# Patient Record
Sex: Male | Born: 1950 | Race: White | Hispanic: No | Marital: Single | Smoking: Current every day smoker
Health system: Southern US, Community
[De-identification: ages and names within clinical notes are randomized; demographics above are authoritative.]

## PROBLEM LIST (undated history)

## (undated) DIAGNOSIS — E785 Hyperlipidemia, unspecified: Secondary | ICD-10-CM

## (undated) DIAGNOSIS — F32A Depression, unspecified: Secondary | ICD-10-CM

## (undated) DIAGNOSIS — K429 Umbilical hernia without obstruction or gangrene: Secondary | ICD-10-CM

## (undated) DIAGNOSIS — F329 Major depressive disorder, single episode, unspecified: Secondary | ICD-10-CM

## (undated) DIAGNOSIS — N529 Male erectile dysfunction, unspecified: Secondary | ICD-10-CM

## (undated) HISTORY — PX: FETAL SURGERY FOR CONGENITAL HERNIA: SHX1618

## (undated) HISTORY — DX: Hyperlipidemia, unspecified: E78.5

## (undated) HISTORY — DX: Male erectile dysfunction, unspecified: N52.9

## (undated) HISTORY — DX: Major depressive disorder, single episode, unspecified: F32.9

## (undated) HISTORY — DX: Depression, unspecified: F32.A

---

## 1996-03-21 ENCOUNTER — Encounter: Payer: Self-pay | Admitting: Internal Medicine

## 2004-11-02 ENCOUNTER — Ambulatory Visit: Payer: Self-pay | Admitting: Internal Medicine

## 2004-11-06 ENCOUNTER — Ambulatory Visit: Payer: Self-pay | Admitting: Internal Medicine

## 2004-11-09 ENCOUNTER — Ambulatory Visit: Payer: Self-pay | Admitting: Endocrinology

## 2004-11-13 ENCOUNTER — Encounter: Admission: RE | Admit: 2004-11-13 | Discharge: 2005-02-11 | Payer: Self-pay | Admitting: Endocrinology

## 2004-11-19 ENCOUNTER — Ambulatory Visit: Payer: Self-pay | Admitting: Endocrinology

## 2004-12-04 ENCOUNTER — Ambulatory Visit: Payer: Self-pay | Admitting: Endocrinology

## 2005-01-29 ENCOUNTER — Ambulatory Visit: Payer: Self-pay | Admitting: Endocrinology

## 2005-02-05 ENCOUNTER — Ambulatory Visit: Payer: Self-pay | Admitting: Endocrinology

## 2005-04-19 ENCOUNTER — Ambulatory Visit: Payer: Self-pay | Admitting: Internal Medicine

## 2005-04-28 ENCOUNTER — Ambulatory Visit: Payer: Self-pay | Admitting: Internal Medicine

## 2005-05-06 ENCOUNTER — Ambulatory Visit: Payer: Self-pay | Admitting: Internal Medicine

## 2005-06-22 ENCOUNTER — Ambulatory Visit: Payer: Self-pay | Admitting: Internal Medicine

## 2005-07-30 ENCOUNTER — Ambulatory Visit: Payer: Self-pay | Admitting: Endocrinology

## 2006-01-28 ENCOUNTER — Ambulatory Visit: Payer: Self-pay | Admitting: Endocrinology

## 2006-08-02 ENCOUNTER — Ambulatory Visit: Payer: Self-pay | Admitting: Endocrinology

## 2006-08-10 ENCOUNTER — Ambulatory Visit: Payer: Self-pay | Admitting: Internal Medicine

## 2006-08-18 ENCOUNTER — Ambulatory Visit: Payer: Self-pay | Admitting: Internal Medicine

## 2006-09-15 ENCOUNTER — Ambulatory Visit: Payer: Self-pay | Admitting: Internal Medicine

## 2006-10-17 ENCOUNTER — Ambulatory Visit: Payer: Self-pay | Admitting: Internal Medicine

## 2006-11-23 ENCOUNTER — Ambulatory Visit: Payer: Self-pay | Admitting: Endocrinology

## 2006-12-29 ENCOUNTER — Ambulatory Visit: Payer: Self-pay | Admitting: Endocrinology

## 2007-02-13 ENCOUNTER — Ambulatory Visit: Payer: Self-pay | Admitting: Endocrinology

## 2007-03-30 ENCOUNTER — Encounter: Payer: Self-pay | Admitting: Endocrinology

## 2007-03-30 DIAGNOSIS — E1065 Type 1 diabetes mellitus with hyperglycemia: Secondary | ICD-10-CM

## 2007-03-30 DIAGNOSIS — E1049 Type 1 diabetes mellitus with other diabetic neurological complication: Secondary | ICD-10-CM | POA: Insufficient documentation

## 2007-05-31 ENCOUNTER — Encounter: Payer: Self-pay | Admitting: Endocrinology

## 2007-05-31 ENCOUNTER — Ambulatory Visit: Payer: Self-pay | Admitting: Endocrinology

## 2007-05-31 LAB — CONVERTED CEMR LAB: Hgb A1c MFr Bld: 11.1 % — ABNORMAL HIGH (ref 4.6–6.0)

## 2007-10-23 ENCOUNTER — Emergency Department (HOSPITAL_COMMUNITY): Admission: EM | Admit: 2007-10-23 | Discharge: 2007-10-23 | Payer: Self-pay | Admitting: Family Medicine

## 2007-10-26 ENCOUNTER — Ambulatory Visit: Payer: Self-pay | Admitting: Endocrinology

## 2007-10-26 DIAGNOSIS — R079 Chest pain, unspecified: Secondary | ICD-10-CM

## 2007-10-31 ENCOUNTER — Encounter: Payer: Self-pay | Admitting: Endocrinology

## 2007-10-31 ENCOUNTER — Ambulatory Visit: Payer: Self-pay

## 2007-10-31 ENCOUNTER — Encounter (INDEPENDENT_AMBULATORY_CARE_PROVIDER_SITE_OTHER): Payer: Self-pay | Admitting: *Deleted

## 2008-01-30 ENCOUNTER — Encounter: Payer: Self-pay | Admitting: Endocrinology

## 2008-02-17 DIAGNOSIS — F339 Major depressive disorder, recurrent, unspecified: Secondary | ICD-10-CM

## 2008-02-20 ENCOUNTER — Encounter: Payer: Self-pay | Admitting: Endocrinology

## 2008-03-11 ENCOUNTER — Ambulatory Visit: Payer: Self-pay | Admitting: Internal Medicine

## 2008-03-28 ENCOUNTER — Telehealth: Payer: Self-pay | Admitting: Internal Medicine

## 2008-04-01 ENCOUNTER — Ambulatory Visit: Payer: Self-pay | Admitting: Internal Medicine

## 2008-04-17 ENCOUNTER — Encounter: Payer: Self-pay | Admitting: Internal Medicine

## 2008-05-09 ENCOUNTER — Ambulatory Visit: Payer: Self-pay | Admitting: Internal Medicine

## 2008-05-14 ENCOUNTER — Ambulatory Visit: Payer: Self-pay | Admitting: Professional

## 2008-05-23 ENCOUNTER — Ambulatory Visit: Payer: Self-pay | Admitting: Professional

## 2008-06-30 ENCOUNTER — Emergency Department (HOSPITAL_COMMUNITY): Admission: EM | Admit: 2008-06-30 | Discharge: 2008-06-30 | Payer: Self-pay | Admitting: Emergency Medicine

## 2008-08-27 ENCOUNTER — Ambulatory Visit: Payer: Self-pay | Admitting: Family Medicine

## 2008-08-27 DIAGNOSIS — Z7721 Contact with and (suspected) exposure to potentially hazardous body fluids: Secondary | ICD-10-CM | POA: Insufficient documentation

## 2008-09-02 LAB — CONVERTED CEMR LAB
Chlamydia, Swab/Urine, PCR: NEGATIVE
GC Probe Amp, Urine: NEGATIVE

## 2008-11-26 ENCOUNTER — Telehealth: Payer: Self-pay | Admitting: Endocrinology

## 2009-01-22 ENCOUNTER — Ambulatory Visit: Payer: Self-pay | Admitting: Endocrinology

## 2009-01-22 LAB — CONVERTED CEMR LAB: Hgb A1c MFr Bld: 10.2 % — ABNORMAL HIGH (ref 4.6–6.5)

## 2009-01-30 ENCOUNTER — Telehealth: Payer: Self-pay | Admitting: Endocrinology

## 2009-02-04 ENCOUNTER — Telehealth: Payer: Self-pay | Admitting: Endocrinology

## 2009-02-25 ENCOUNTER — Ambulatory Visit: Payer: Self-pay | Admitting: Family Medicine

## 2009-03-18 ENCOUNTER — Ambulatory Visit: Payer: Self-pay | Admitting: Internal Medicine

## 2009-05-19 ENCOUNTER — Ambulatory Visit: Payer: Self-pay | Admitting: Internal Medicine

## 2009-09-22 ENCOUNTER — Telehealth: Payer: Self-pay | Admitting: Internal Medicine

## 2009-09-26 ENCOUNTER — Ambulatory Visit: Payer: Self-pay | Admitting: Internal Medicine

## 2009-09-29 ENCOUNTER — Ambulatory Visit: Payer: Self-pay | Admitting: Radiology

## 2009-09-29 ENCOUNTER — Ambulatory Visit: Payer: Self-pay | Admitting: Internal Medicine

## 2009-09-29 ENCOUNTER — Encounter: Payer: Self-pay | Admitting: Emergency Medicine

## 2009-09-29 ENCOUNTER — Inpatient Hospital Stay (HOSPITAL_COMMUNITY): Admission: AD | Admit: 2009-09-29 | Discharge: 2009-10-03 | Payer: Self-pay | Admitting: Internal Medicine

## 2009-10-01 ENCOUNTER — Encounter (INDEPENDENT_AMBULATORY_CARE_PROVIDER_SITE_OTHER): Payer: Self-pay | Admitting: Internal Medicine

## 2009-10-02 ENCOUNTER — Encounter: Payer: Self-pay | Admitting: Internal Medicine

## 2009-10-28 ENCOUNTER — Ambulatory Visit: Payer: Self-pay | Admitting: Internal Medicine

## 2009-10-28 DIAGNOSIS — E785 Hyperlipidemia, unspecified: Secondary | ICD-10-CM | POA: Insufficient documentation

## 2009-10-29 ENCOUNTER — Telehealth (INDEPENDENT_AMBULATORY_CARE_PROVIDER_SITE_OTHER): Payer: Self-pay | Admitting: *Deleted

## 2009-10-29 LAB — CONVERTED CEMR LAB
Bilirubin, Direct: 0 mg/dL (ref 0.0–0.3)
LDL Cholesterol: 92 mg/dL (ref 0–99)
Total Bilirubin: 0.3 mg/dL (ref 0.3–1.2)
VLDL: 12 mg/dL (ref 0.0–40.0)

## 2009-11-06 ENCOUNTER — Telehealth: Payer: Self-pay | Admitting: Internal Medicine

## 2009-11-06 ENCOUNTER — Ambulatory Visit: Payer: Self-pay | Admitting: Internal Medicine

## 2009-11-10 ENCOUNTER — Telehealth: Payer: Self-pay | Admitting: Internal Medicine

## 2009-11-11 ENCOUNTER — Ambulatory Visit: Payer: Self-pay | Admitting: Internal Medicine

## 2009-11-12 ENCOUNTER — Encounter: Payer: Self-pay | Admitting: Internal Medicine

## 2009-11-27 ENCOUNTER — Telehealth: Payer: Self-pay | Admitting: Endocrinology

## 2009-12-10 ENCOUNTER — Telehealth: Payer: Self-pay | Admitting: Internal Medicine

## 2009-12-10 ENCOUNTER — Ambulatory Visit: Payer: Self-pay | Admitting: Internal Medicine

## 2009-12-11 LAB — CONVERTED CEMR LAB
ALT: 34 units/L (ref 0–53)
AST: 25 units/L (ref 0–37)

## 2010-02-02 ENCOUNTER — Ambulatory Visit: Payer: Self-pay | Admitting: Internal Medicine

## 2010-02-02 DIAGNOSIS — L255 Unspecified contact dermatitis due to plants, except food: Secondary | ICD-10-CM | POA: Insufficient documentation

## 2010-03-16 ENCOUNTER — Telehealth: Payer: Self-pay | Admitting: Endocrinology

## 2010-03-23 ENCOUNTER — Ambulatory Visit: Payer: Self-pay | Admitting: Internal Medicine

## 2010-03-24 LAB — CONVERTED CEMR LAB
BUN: 19 mg/dL (ref 6–23)
Basophils Absolute: 0 10*3/uL (ref 0.0–0.1)
Chloride: 100 meq/L (ref 96–112)
Creatinine,U: 194.1 mg/dL
Eosinophils Absolute: 0.1 10*3/uL (ref 0.0–0.7)
GFR calc non Af Amer: 100.43 mL/min (ref >60)
Glucose, Bld: 259 mg/dL — ABNORMAL HIGH (ref 70–99)
HCT: 40 % (ref 39.0–52.0)
HDL: 62.4 mg/dL (ref >39.00)
Lymphocytes Relative: 26.2 % (ref 12.0–46.0)
Lymphs Abs: 1.7 10*3/uL (ref 0.7–4.0)
MCV: 96.1 fL (ref 78.0–100.0)
Microalb Creat Ratio: 1.2 mg/g (ref 0.0–30.0)
Monocytes Relative: 6.1 % (ref 3.0–12.0)
Phosphorus: 2.6 mg/dL (ref 2.3–4.6)
Platelets: 233 10*3/uL (ref 150.0–400.0)
RDW: 13.5 % (ref 11.5–14.6)
TSH: 0.87 microintl units/mL (ref 0.35–5.50)
Triglycerides: 42 mg/dL (ref 0.0–149.0)
WBC: 6.7 10*3/uL (ref 4.5–10.5)

## 2010-04-06 ENCOUNTER — Ambulatory Visit: Payer: Self-pay | Admitting: Internal Medicine

## 2010-06-05 ENCOUNTER — Telehealth: Payer: Self-pay | Admitting: Endocrinology

## 2010-06-11 ENCOUNTER — Ambulatory Visit: Payer: Self-pay | Admitting: Endocrinology

## 2010-08-07 ENCOUNTER — Telehealth: Payer: Self-pay | Admitting: Endocrinology

## 2010-08-20 ENCOUNTER — Ambulatory Visit: Payer: Self-pay | Admitting: Family Medicine

## 2010-09-10 ENCOUNTER — Ambulatory Visit: Admit: 2010-09-10 | Payer: Self-pay | Admitting: Endocrinology

## 2010-09-29 NOTE — Assessment & Plan Note (Signed)
Summary: follow up to refill rx-lb   Vital Signs:  Patient profile:   60 year old male Height:      75 inches (190.50 cm) Weight:      183.50 pounds (83.41 kg) BMI:     23.02 O2 Sat:      99 % on Room air Temp:     98.7 degrees F (37.06 degrees C) oral Pulse rate:   72 / minute BP sitting:   128 / 78  (left arm) Cuff size:   regular  Vitals Entered By: Brenton Grills MA (June 11, 2010 11:00 AM)  O2 Flow:  Room air CC: F/U to refil meds/aj Is Patient Diabetic? Yes   Referring Provider:  letvak Primary Provider:  letvak  CC:  F/U to refil meds/aj.  History of Present Illness: pt is long overdue for f/u.  he was hospitalized for dka 8 mos ago. in the hospital, he was changed to a regimen of multiple daily insulin injections.  a1c was high 3 mos ago, and he declines to repeat it today.  no cbg record, but states cbg's are "much better recently."  he says he did not check his cbg for 1 month, approx 1 month ago, when his meter was broken.    Current Medications (verified): 1)  Nitrostat 0.4 Mg  Subl (Nitroglycerin) .... One Tablet Under The Tongue Every 5 Minutes As Needed For Chest Pain 2)  Accu-Chek Aviva   Strp (Glucose Blood) .... Check Blood Sugar Four Times A Day 3)  Citalopram Hydrobromide 40 Mg Tabs (Citalopram Hydrobromide) .Marland Kitchen.. 1 Tab Daily 4)  Centrum Silver Ultra Mens  Tabs (Multiple Vitamins-Minerals) .... Take 1 By Mouth Once Daily 5)  Aspir-Low 81 Mg Tbec (Aspirin) .... Take 1 By Mouth Once Daily 6)  Lantus Solostar 100 Unit/ml Soln (Insulin Glargine) .... Take 20 Units Two Times A Day 7)  Novolog Flexpen 100 Unit/ml Soln (Insulin Aspart) .... Take 4-6 Units Before Meals Once Daily 8)  Simvastatin 10 Mg Tabs (Simvastatin) .... Take 1 By Mouth Once Daily 9)  Triamcinolone Acetonide 0.1 % Lotn (Triamcinolone Acetonide) .... Apply Three Times A Day As Needed For Itching  Allergies (verified): 1)  ! Penicillin  Past History:  Past Medical History: Last updated:  10/28/2009 Diabetes mellitus, type I E.D. Smoker Depression Hyperlipidemia  Review of Systems  The patient denies hypoglycemia.    Physical Exam  General:  normal appearance.   Pulses:  dorsalis pedis intact bilat.    Extremities:  no deformity.  no ulcer on the feet.  feet are of normal color and temp.  no edema  Neurologic:  sensation is intact to touch on the feet    Impression & Recommendations:  Problem # 1:  DIABETES MELLITUS, TYPE I (ICD-250.01) due to chronic noncompliance, goals need to be modest in this patient.  to that end, a simpler insulin regimen would be better, but he declines this.  Other Orders: Est. Patient Level III ([address])  Patient Instructions: 1)  check your blood sugar 2 times a day.  vary the time of day when you check, between before the 3 meals, and at bedtime.  also check if you have symptoms of your blood sugar being too high or too low.  please keep a record of the readings and bring it to your next appointment here.  please call us sooner if you are having low blood sugar episodes. 2)  continue same insulin for now. 3)  Please schedule a follow-up appointment  in 3 months.

## 2010-09-29 NOTE — Assessment & Plan Note (Signed)
Summary: 2:15  Jackson [name]   Salley Scarlet   Vital Signs:  Patient profile:   60 year old male Weight:      188 pounds BMI:     23.58 Temp:     99.3 degrees F oral BP sitting:   110 / 70  (left arm) Cuff size:   large  Vitals Entered By: Mervin Hack CMA Duncan Dull) (February 02, 2010 2:19 PM) CC: RASH   History of Present Illness: Has accepted early retirement from the [employer] retired as of May 31st Still working through legal case  Started with rash 4 days ago initial itching in back of neck then forehead both arms and legs involved  Was trimming bushes 2 days before rash came on  Just used OTC cream thus far not much help  stopped the concerta after retirement still has low energy, etc   Allergies: 1)  ! Penicillin  Past History:  Past medical, surgical, family and social histories (including risk factors) reviewed for relevance to current acute and chronic problems.  Past Medical History: Reviewed history from 10/28/2009 and no changes required. Diabetes mellitus, type I E.D. Smoker Depression Hyperlipidemia  Past Surgical History: Reviewed history from 03/11/2008 and no changes required. 1979 Manic epidose  ~1990 Umbilical herniorrhaphy 8/97 Negative treadmill stress  Family History: Reviewed history from 03/11/2008 and no changes required. Dad died @84  of dementia, CVA, HTN, DM Mom with COPD 1 brohter 1 sister died of breast cancer @49  Pat GM & Mat GF died of MI DM in mat GF, Dad, Pat GM No prostate or colon cancer Daughter with ADHD  Social History: Reviewed history from 03/11/2008 and no changes required. 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   Skin:  widespread fairly typical  raised papulo vesicular rash   Impression & Recommendations:  Problem # 1:  CONTACT DERMATITIS&OTHER ECZEMA DUE TO PLANTS (ICD-692.6) Assessment New  will use prednisone for 2 weeks topical steroid  as well  His updated medication list for this problem includes:    Prednisone 20 Mg Tabs (Prednisone) .Marland Kitchen... 2 tabs daily for 1 week then 1 tab daily for 1 week    Triamcinolone Acetonide 0.1 % Lotn (Triamcinolone acetonide) .Marland Kitchen... Apply three times a day as needed for itching  Complete Medication List: 1)  Nitrostat 0.4 Mg Subl (Nitroglycerin) .... One tablet under the tongue every 5 minutes as needed for chest pain 2)  Accu-chek Aviva Strp (Glucose blood) .... Check blood sugar four times a day 3)  Citalopram Hydrobromide 40 Mg Tabs (Citalopram hydrobromide) .Marland Kitchen.. 1 tab daily 4)  Centrum Silver Ultra Mens Tabs (Multiple vitamins-minerals) .... Take 1 by mouth once daily 5)  Aspir-low 81 Mg Tbec (Aspirin) .... Take 1 by mouth once daily 6)  Lantus Solostar 100 Unit/ml Soln (Insulin glargine) .... Take 20 units two times a day 7)  Novolog Flexpen 100 Unit/ml Soln (Insulin aspart) .... Take 4-6 units before meals once daily 8)  Simvastatin 10 Mg Tabs (Simvastatin) .... Take 1 by mouth once daily 9)  Prednisone 20 Mg Tabs (Prednisone) .... 2 tabs daily for 1 week then 1 tab daily for 1 week 10)  Triamcinolone Acetonide 0.1 % Lotn (Triamcinolone acetonide) .... Apply three  times a day as needed for itching  Patient Instructions: 1)  Keep physical appt Prescriptions: TRIAMCINOLONE ACETONIDE 0.1 % LOTN (TRIAMCINOLONE ACETONIDE) apply three times a day as needed for itching  #120cc x 1   Entered and Authorized by:   Cindee Salt MD   Signed by:   Cindee Salt MD on 02/02/2010   Method used:   Electronically to        [facility]* (retail)       [address]       [place], [place]  [address]       Ph: [phone]       Fax: [phone]   RxID:   [id number] PREDNISONE 20  MG TABS (PREDNISONE) 2 tabs daily for 1 week then 1 tab daily for 1 week  #21 x 0   Entered and Authorized by:   Cindee Salt MD   Signed by:   Cindee Salt MD on 02/02/2010   Method used:   Electronically to        [facility]* (retail)       [address]       [place], [place]  [address]       Ph: [phone]       Fax: [phone]   RxID:   [id number]   Current Allergies (reviewed today): ! PENICILLIN

## 2010-09-29 NOTE — Progress Notes (Signed)
Summary: Rx refill req  Phone Note Refill Request Message from:  Patient on June 05, 2010 2:03 PM  Refills Requested: Medication #1:  LANTUS SOLOSTAR 100 UNIT/ML SOLN take 20 units two times a day   Dosage confirmed as above?Dosage Confirmed   Supply Requested: 1 month Pt has appt schd 10/13   Method Requested: Electronic Initial call taken by: Margaret Pyle, CMA,  June 05, 2010 2:04 PM    Prescriptions: LANTUS SOLOSTAR 100 UNIT/ML SOLN (INSULIN GLARGINE) take 20 units two times a day  #1 month x 0   Entered by:   Margaret Pyle, CMA   Authorized by:   Minus Breeding MD   Signed by:   Margaret Pyle, CMA on 06/05/2010   Method used:   Electronically to        [facility]* (retail)       [address]       [place], [place]  [address]       Ph: [phone]       Fax: [phone]   RxID:   [id number]

## 2010-09-29 NOTE — Consult Note (Signed)
[facility]: [facility]   Imported By: Roderic Ovens 10/03/2009 13:12:41  _____________________________________________________________________  External Attachment:    Type:   Image     Comment:   External Document

## 2010-09-29 NOTE — Assessment & Plan Note (Signed)
Summary: 8:00 APPT MEDICATION /DS   Vital Signs:  Patient profile:   60 year old male Weight:      169 pounds Temp:     98.3 degrees F oral Pulse rate:   80 / minute Pulse rhythm:   regular BP sitting:   118 / 68  (left arm) Cuff size:   large  Vitals Entered By: Mervin Hack CMA Duncan Dull) (September 26, 2009 8:04 AM) CC: discuss meds   History of Present Illness: Ongoing problems with "postal career" Pretrial hearing is set next month after mediation next week Attorney withdrew from case (he couldn't get her her retainer) Has to represent himself Lots of work to do Needs to do this while maintaining his work committments  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 Penicillin  Past History:  Past medical, surgical, family and social histories (including risk factors) reviewed for relevance to current acute and chronic problems.  Past Medical History: Reviewed history from 03/11/2008 and no changes required. Diabetes mellitus, type I E.D. Smoker Depression  Past Surgical History: Reviewed history from 03/11/2008 and no changes required. 1979 Manic epidose  ~1990 Umbilical herniorrhaphy 8/97 Negative treadmill stress  Family History: Reviewed history from 03/11/2008 and no changes required. Dad died @84  of dementia, CVA, HTN, DM Mom with COPD 1 brohter 1 sister died of breast cancer @49  Pat GM & Mat GF died of MI DM in mat GF, Dad, Pat GM No prostate or colon cancer Daughter with ADHD  Social History: Reviewed history from 03/11/2008 and no changes required. Divorced---2 children Lives with daughter and mother Current Smoker Alcohol  use-yes Occupation: Postal service  Review of Systems       sleeps okay but has freq nocturia Does go to sleep on couch in evening at times and that will disturb the routines Appetite is okay  Physical Exam  General:  alert and normal appearance.   Psych:  normally interactive, good eye contact, not anxious appearing, and not depressed appearing.     Impression & Recommendations:  Problem # 1:  DEPRESSION (ICD-311) Assessment Comment Only having ongoing mood issues has attenitional problems as well  P: he will go to mediation    I urged him to compromise if possible    if he goes to trial, I will give him 1 month of 18-36mg  concerta  His updated medication list for this problem includes:    Citalopram Hydrobromide 40 Mg Tabs (Citalopram hydrobromide) .Marland Kitchen... 1 tab daily  Complete Medication List: 1)  Humalog Mix 75/25 Kwikpen 75-25 % Susp (Insulin lispro prot & lispro) .... 20 units two times a day (just before first and last meals of the day) 2)  Nitrostat 0.4 Mg Subl (Nitroglycerin) .... One tablet under the tongue every 5 minutes as needed for chest pain 3)  Accu-chek Aviva Strp (Glucose blood) .... Check blood sugar four times a day 4)  Citalopram Hydrobromide 40 Mg Tabs (Citalopram hydrobromide) .Marland Kitchen.. 1 tab daily 5)  Centrum Silver Ultra Mens Tabs (Multiple vitamins-minerals) .... Take 1 by mouth once daily 6)  Aspir-low 81 Mg Tbec (Aspirin) .... Take 1 by mouth once daily  Patient Instructions: 1)  Keep February 7th physical  Current Allergies (reviewed today): ! PENICILLIN

## 2010-09-29 NOTE — Progress Notes (Signed)
Summary: novolog  Phone Note Refill Request Message from:  Fax from Pharmacy  Refills Requested: Medication #1:  NOVOLOG FLEXPEN 100 UNIT/ML SOLN take 4-6 units before meals once daily   Dosage confirmed as above?Dosage Confirmed  Method Requested: Fax to Local Pharmacy Initial call taken by: Brenton Grills MA,  March 16, 2010 3:53 PM    Prescriptions: NOVOLOG FLEXPEN 100 UNIT/ML SOLN (INSULIN ASPART) take 4-6 units before meals once daily  # 1 month x 3   Entered by:   Brenton Grills MA   Authorized by:   Minus Breeding MD   Signed by:   Brenton Grills MA on 03/16/2010   Method used:   Electronically to        [facility]* (retail)       [address]       [place], [place]  [address]       Ph: [phone]       Fax: [phone]   RxID:   [id number]

## 2010-09-29 NOTE — Progress Notes (Signed)
Summary: FMLA FORM...  Phone Note Call from Patient   Summary of Call: Mr. Furry faxed over FMLA forms to the office on today.  Says he will need them back by tomorrow morning.  Told pt I would give you this information, however.  We would need time complete all paperwork.  Pt would like for you  to call him... cell # [phone]   Form has been placed on your desk....  Initial call taken by: Daine Gip,  October 29, 2009 3:10 PM  Follow-up for Phone Call        discussed with patient form done Okay to fax now he will drop off $20 charge Follow-up by: Cindee Salt MD,  October 30, 2009 1:23 PM  Additional Follow-up for Phone Call Additional follow up Details #1::        Form faxed to patient. Additional Follow-up by: Beau Fanny,  October 30, 2009 1:45 PM

## 2010-09-29 NOTE — Assessment & Plan Note (Signed)
Summary: CPX/RBH  R/S FROM 03/13/10   Vital Signs:  Patient profile:   60 year old male Weight:      183.38 pounds Temp:     98.4 degrees F oral Pulse rate:   60 / minute Pulse rhythm:   regular BP sitting:   116 / 70  (left arm) Cuff size:   large  Vitals Entered By: Sydell Axon LPN (March 23, 2010 11:29 AM) CC: 30 Minute checkup   History of Present Illness: Still has pending case with the [employer] Has been less stressed since he is retired --doesn't have the day to day frustrations there Has been in "vacation mode" Up in [place] visiting son for part of this time  Diabetes control okay checks sugars two times a day --uses this as guide as to whether he needs novolog  Allergies: 1)  ! Penicillin  Past History:  Past medical, surgical, family and social histories (including risk factors) reviewed for relevance to current acute and chronic problems.  Past Medical History: Reviewed history from 10/28/2009 and no changes required. Diabetes mellitus, type I E.D. Smoker Depression Hyperlipidemia  Past Surgical History: Reviewed history from 03/11/2008 and no changes required. 1979 Manic epidose  ~1990 Umbilical herniorrhaphy 8/97 Negative treadmill stress  Family History: Reviewed history from 03/11/2008 and no changes required. Dad died @84  of dementia, CVA, HTN, DM Mom with COPD 1 brohter 1 sister died of breast cancer @49  Pat GM & Mat GF died of MI DM in mat GF, Dad, Pat GM No prostate or colon cancer Daughter with ADHD  Social History: Reviewed history from 02/02/2010 and no changes required. 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 Eyes:  Denies double vision and vision loss-1 eye; did have some floaters in right eye about 3 weeks ago. ENT:  Denies decreased  hearing and ringing in ears; needs work on his teeth--- plans to deal with it sometime soon. CV:  Denies chest pain or discomfort, difficulty breathing at night, difficulty breathing while lying down, fainting, lightheadness, palpitations, and shortness of breath with exertion; Plans to start exercising soon --"I don't have the energy". Resp:  Complains of cough; denies shortness of breath; rare cough. GI:  Denies abdominal pain, bloody stools, change in bowel habits, dark tarry stools, indigestion, nausea, and vomiting. GU:  Denies dysuria, erectile dysfunction, urinary frequency, and urinary hesitancy. MS:  Denies joint swelling; some neck cracking and occ pain. Derm:  Complains of rash; slightly hyperpigmented rash on upper right abd No flaking or itching. Neuro:  Complains of headaches; denies numbness, tingling, and weakness; occ headaches. Psych:  Denies anxiety and depression. Heme:  Denies abnormal bruising and enlarge lymph nodes. Allergy:  Denies seasonal allergies and sneezing.  Physical Exam  General:  alert and normal appearance.   Eyes:  pupils equal, pupils round, and pupils reactive to light.   Ears:  R ear normal and L ear normal.   Mouth:  no erythema, no exudates, and no lesions.   Neck:  supple, no masses, no thyromegaly, no carotid bruits, and no cervical lymphadenopathy.   Lungs:  normal respiratory effort, no intercostal retractions, no accessory muscle use, and normal breath sounds.   Heart:  normal rate, regular rhythm, no murmur, and no gallop.   Abdomen:  soft, non-tender, and no masses.   Rectal:  no hemorrhoids and  no masses.   Prostate:  no gland enlargement and no nodules.   Msk:  no joint tenderness and no joint swelling.   Pulses:  1+ in feet Extremities:  no edema Neurologic:  alert & oriented X3, strength normal in all extremities, and gait normal.   Skin:  hyperpigemnted area on upper right abdomen/lower chest  (could be TV--discussed trying  selsun)  benign nevi Axillary Nodes:  No palpable lymphadenopathy Psych:  normally interactive, good eye contact, not anxious appearing, and not depressed appearing.    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 Take 1 by mouth once daily    Lantus Solostar 100 Unit/ml Soln (Insulin glargine) .Marland Kitchen... Take 20 units two times a day    Novolog Flexpen 100 Unit/ml Soln (Insulin aspart) .Marland Kitchen... Take 4-6 units before meals once daily  Last Eye Exam: no retinopathy (08/04/2009) Reviewed HgBA1c results: 10.2 (01/22/2009)  11.3 (10/26/2007)  Orders: TLB-A1C / Hgb A1C (Glycohemoglobin) (83036-A1C) TLB-Microalbumin/Creat Ratio, Urine (82043-MALB) TLB-Lipid Panel (80061-LIPID) TLB-CBC Platelet - w/Differential (85025-CBCD) TLB-TSH (Thyroid Stimulating Hormone) (84443-TSH) TLB-Renal Function Panel (80069-RENAL) Venipuncture ([address])  Complete Medication List: 1)  Nitrostat 0.4 Mg Subl (Nitroglycerin) .... One tablet under the tongue every 5 minutes as needed for chest pain 2)  Accu-chek Aviva Strp (Glucose blood)  .... Check blood sugar four times a day 3)  Citalopram Hydrobromide 40 Mg Tabs (Citalopram hydrobromide) .Marland Kitchen.. 1 tab daily 4)  Centrum Silver Ultra Mens Tabs (Multiple vitamins-minerals) .... Take 1 by mouth once daily 5)  Aspir-low 81 Mg Tbec (Aspirin) .... Take 1 by mouth once daily 6)  Lantus Solostar 100 Unit/ml Soln (Insulin glargine) .... Take 20 units two times a day 7)  Novolog Flexpen 100 Unit/ml Soln (Insulin aspart) .... Take 4-6 units before meals once daily 8)  Simvastatin 10 Mg Tabs (Simvastatin) .... Take 1 by mouth once daily 9)  Triamcinolone Acetonide 0.1 % Lotn (Triamcinolone acetonide) .... Apply three times a day as needed for itching  Other Orders: TLB-PSA (Prostate Specific Antigen) (84153-PSA)   Patient Instructions: 1)  Please schedule a follow-up appointment in 6 months .  2)  Please set up appt with Dr Everardo All in 3-4 months  Current Allergies (reviewed today): ! PENICILLIN  Immunization History:  Tetanus/Td  Immunization History:    Tetanus/Td:  Td (11/02/2004)  Pneumovax Immunization History:    Pneumovax:  Historical (09/22/2009)

## 2010-09-29 NOTE — Miscellaneous (Signed)
Summary: Controlled Substances Contract / [facility]  Controlled Substances Contract / [facility]   Imported By: Lennie Odor 02/06/2010 16:20:36  _____________________________________________________________________  External Attachment:    Type:   Image     Comment:   External Document

## 2010-09-29 NOTE — Progress Notes (Signed)
Summary: Prior Authorization Concerta 18mg   Phone Note From Pharmacy Call back at ph [phone] fax [phone]   Caller: [facility]* Call For: Dr. Alphonsus Sias  Summary of Call: Received form from pharmacy stating that PA is needed for Concerta 18mg .  Called the FEP program at [phone] to get prior auth form.  Was advised that the PA form could take 24-48 hours to be faxed to our office.  Linde Gillis CMA Duncan Dull)  November 10, 2009 8:07 AM   Received PA form, in your IN box.   Initial call taken by: Linde Gillis CMA Duncan Dull),  November 10, 2009 9:28 AM  Follow-up for Phone Call        form done Follow-up by: Cindee Salt MD,  November 10, 2009 10:10 AM  Additional Follow-up for Phone Call Additional follow up Details #1::        form faxed to [facility] Additional Follow-up by: DeShannon Katrinka Blazing CMA Duncan Dull),  November 10, 2009 10:38 AM     Appended Document: Prior Authorization Concerta 18mg  Received PA Approval for Concerta 18mg , a quantity of 90 every 90 days.  Valid from 11/06/2009 through 11/07/2010.  Patient and pharmacy notified.

## 2010-09-29 NOTE — Assessment & Plan Note (Signed)
Summary: F/U [place]  D/C 10/03/09/[facility]   Vital Signs:  Patient profile:   60 year old male Weight:      182 pounds Temp:     96.6 degrees F oral Pulse rate:   80 / minute Pulse rhythm:   regular BP sitting:   110 / 68  (left arm) Cuff size:   large  Vitals Entered By: Mervin Hack CMA Duncan Dull) (October 28, 2009 9:29 AM) CC: hospital follow-up   History of Present Illness: Went into DKA  Stress caught up with him Hadn't really thought about his diabetes  Now has changed insulin to lantus and novolog before meals Eating better has had a couple of mildly low sugars--nothing bad  Started on simvastatin in hospital hasn't had BW yet on this No apparent problems  Day he was admitted was his mediation appt with postal service They may be ready to settle hearing is now April 14th  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 Penicillin  Past History:  Past medical, surgical, family and social histories (including risk factors) reviewed for relevance to current acute and chronic problems.  Past Medical History: Diabetes mellitus, type I E.D. Smoker Depression Hyperlipidemia  Past Surgical History: Reviewed history from 03/11/2008 and no changes required. 1979 Manic epidose  ~1990 Umbilical herniorrhaphy 8/97 Negative treadmill stress  Family History: Reviewed history from 03/11/2008 and no changes required. Dad died @84  of dementia, CVA, HTN, DM Mom with COPD 1 brohter 1 sister died of breast cancer @49  Pat GM & Mat GF died of MI DM in mat GF, Dad, Pat GM No prostate or colon cancer Daughter with ADHD  Social History: Reviewed history from 03/11/2008 and no changes required. Divorced---2 children Lives with daughter and mother Current Smoker Alcohol use-yes Occupation: Postal service  Review of Systems       weight is up 13#---  very positive for him sleeping much better daughter  moved back to [place] Mom fell and was seen in the ER--then admitted. Diagnosed with compression fracture. Now at [facility] for rehab Now home in the house  Physical Exam  General:  alert and normal appearance.   Looks much better!!! Psych:  normally interactive, good eye contact, not anxious appearing, and not depressed appearing.   Much more relaxed   Impression & Recommendations:  Problem # 1:  DEPRESSION, MAJOR, RECURRENT (ICD-296.30) Assessment Improved mood is much better hopes to resolve issues with postal service---and even retire with incentive (they need people to retire) continue current meds  Problem # 2:  HYPERLIPIDEMIA (ICD-272.4) Assessment: Comment Only  will check labs on new medicine  His updated medication list for this problem includes:    Simvastatin 10 Mg Tabs (Simvastatin) .Marland Kitchen... Take 1 by mouth once daily  Orders: TLB-Lipid Panel (80061-LIPID) TLB-Hepatic/Liver Function Pnl (80076-HEPATIC) Venipuncture (56213)  Problem # 3:  DIABETES MELLITUS, TYPE I (ICD-250.01) Assessment: Comment Only on a more physiologic regimen now Dr Everardo All follows  The following medications were removed from the medication list:    Humalog Mix 75/25 Kwikpen 75-25 % Susp (Insulin lispro prot & lispro) .Marland Kitchen... 20 units two times a day (just before first and last meals of the day) His updated medication list for this problem includes:    Aspir-low 81 Mg Tbec (Aspirin) .Marland Kitchen... Take 1 by mouth once daily    Lantus Solostar 100 Unit/ml Soln (Insulin glargine) .Marland Kitchen... Take 20 units  two times a day    Novolog Flexpen 100 Unit/ml Soln (Insulin aspart) .Marland Kitchen... Take 4-6 units before meals once daily  Complete Medication List: 1)  Nitrostat 0.4 Mg Subl (Nitroglycerin) .... One tablet under the tongue every 5 minutes as needed for chest pain 2)  Accu-chek Aviva Strp (Glucose blood) .... Check blood sugar four times a day 3)  Citalopram Hydrobromide 40 Mg Tabs (Citalopram hydrobromide) .Marland Kitchen.. 1  tab daily 4)  Centrum Silver Ultra Mens Tabs (Multiple vitamins-minerals) .... Take 1 by mouth once daily 5)  Aspir-low 81 Mg Tbec (Aspirin) .... Take 1 by mouth once daily 6)  Lantus Solostar 100 Unit/ml Soln (Insulin glargine) .... Take 20 units two times a day 7)  Novolog Flexpen 100 Unit/ml Soln (Insulin aspart) .... Take 4-6 units before meals once daily 8)  Simvastatin 10 Mg Tabs (Simvastatin) .... Take 1 by mouth once daily  Patient Instructions: 1)  Please schedule a follow-up appointment in 3-4  months for physical Prescriptions: SIMVASTATIN 10 MG TABS (SIMVASTATIN) take 1 by mouth once daily  #30 x 12   Entered and Authorized by:   Cindee Salt MD   Signed by:   Cindee Salt MD on 10/28/2009   Method used:   Electronically to        [facility]* (retail)       [address]       [place], [place]  [address]       Ph: [phone]       Fax: [phone]   RxID:   [id number]   Current Allergies (reviewed today): ! PENICILLIN

## 2010-09-29 NOTE — Progress Notes (Signed)
Summary: Rx Concerta  Phone Note Refill Request Call back at [phone] Message from:  Patient on December 10, 2009 8:51 AM  Refills Requested: Medication #1:  CONCERTA 18 MG CR-TABS 1 daily as directed for attention problems. Patient request Rx refill, please call when ready for pick up.   Method Requested: Pick up at Office Initial call taken by: Linde Gillis CMA Duncan Dull),  December 10, 2009 8:55 AM  Follow-up for Phone Call        Rx written Follow-up by: Cindee Salt MD,  December 10, 2009 12:32 PM  Additional Follow-up for Phone Call Additional follow up Details #1::        Spoke with patient and advised rx ready for pick-up  Additional Follow-up by: Mervin Hack CMA Duncan Dull),  December 10, 2009 1:04 PM    Prescriptions: CONCERTA 18 MG CR-TABS (METHYLPHENIDATE HCL) 1 daily as directed for attention problems  #30 x 0   Entered and Authorized by:   Cindee Salt MD   Signed by:   Cindee Salt MD on 12/10/2009   Method used:   Print then Give to Patient   RxID:   [id number]

## 2010-09-29 NOTE — Medication Information (Signed)
Summary: Approval for Concerta/[employer]BS  Approval for Concerta/BCBS   Imported By: Lanelle Bal 11/17/2009 10:39:46  _____________________________________________________________________  External Attachment:    Type:   Image     Comment:   External Document  Appended Document: Approval for Concerta/BCBS approved

## 2010-09-29 NOTE — Progress Notes (Signed)
Summary: Patient wants meds.  Phone Note Call from Patient   Caller: Patient Call For: Cindee Salt MD Summary of Call: Patient called says that you discussed prescibing some medications for him. Patient says due to current circumstances he would like to speak furthur with you about this patient would like a phone call from you  telephone number is [phone] Initial call taken by: Benny Lennert CMA Duncan Dull),  September 22, 2009 11:23 AM  Follow-up for Phone Call        soke with patient and he would like rx for Ritalin XL 20mg , pt states on this medication he seems more productive and more focused. Pt is having his pre-trial hearing on 2/14 and the hearing on 3/3 and pt will be representing himself because his attorney withdrew from his case. Pt states he has a really thick file to present his case and with this medication he would be able to continue with his trial. Pt has appt on 10/06/2009 here with Dr. Alphonsus Sias. DeShannon Katrinka Blazing CMA Duncan Dull)  September 22, 2009 11:32 AM   Additional Follow-up for Phone Call Additional follow up Details #1::        this may be a medicine we can consider (I generally don't use ritalin XL though) This is heavily regulated and I will not prescribe it unless we have a face to face visit and discussion about it Okay to add on at end of Am in next couple of days if he desires Additional Follow-up by: Cindee Salt MD,  September 22, 2009 1:21 PM    Additional Follow-up for Phone Call Additional follow up Details #2::    Spoke with patient and advised results. Appt scheduled for 09/26/2009 @ 12:30 Follow-up by: Mervin Hack CMA (AAMA),  September 22, 2009 3:25 PM

## 2010-09-29 NOTE — Letter (Signed)
Summary: [facility]: Immunoassay Fecal Occult Blood (iFOB) Order Form  [facility] at [facility]  [address] [place], [place] [address]   Phone: [phone]  Fax: [phone]      [facility]: Immunoassay Fecal Occult Blood (iFOB) Order Form   March 23, 2010 MRN: [id number]   [facility] 10-08-1950   Physicican Name:_______Letvak__________________  Diagnosis Code:________V76.51__________________      Cindee Salt MD

## 2010-09-29 NOTE — Progress Notes (Signed)
  Phone Note Refill Request Message from:  Fax from Pharmacy on November 27, 2009 10:56 AM  Refills Requested: Medication #1:  LANTUS SOLOSTAR 100 UNIT/ML SOLN take 20 units two times a day   Dosage confirmed as above?Dosage Confirmed Initial call taken by: Josph Macho RMA,  November 27, 2009 10:56 AM    Prescriptions: LANTUS SOLOSTAR 100 UNIT/ML SOLN (INSULIN GLARGINE) take 20 units two times a day  #1 month x 2   Entered by:   Josph Macho RMA   Authorized by:   Minus Breeding MD   Signed by:   Josph Macho RMA on 11/27/2009   Method used:   Electronically to        [facility]* (retail)       [address]       [place], [place]  [address]       Ph: [phone]       Fax: [phone]   RxID:   [id number]

## 2010-09-29 NOTE — Progress Notes (Signed)
Summary: Forms  Phone Note Call from Patient Call back at Home Phone [phone]   Caller: Patient Call For: Cindee Salt MD Summary of Call: pt walked in today with more forms to be filled out which are on your desk. Pt would also like rx for concerta, his trial is coming up soon. Please advise. Initial call taken by: Mervin Hack CMA Duncan Dull),  November 06, 2009 2:00 PM  Follow-up for Phone Call        okay to start the concerta I will get to the forms when I can--may not be till next week as I was out all week until today  LEt him know he needs to pick up the Rx Follow-up by: Cindee Salt MD,  November 06, 2009 2:51 PM  Additional Follow-up for Phone Call Additional follow up Details #1::        Spoke with patient and advised results.  Additional Follow-up by: Mervin Hack CMA Duncan Dull),  November 06, 2009 3:48 PM    New/Updated Medications: CONCERTA 18 MG CR-TABS (METHYLPHENIDATE HCL) 1 daily as directed for attention problems Prescriptions: CONCERTA 18 MG CR-TABS (METHYLPHENIDATE HCL) 1 daily as directed for attention problems  #30 x 0   Entered and Authorized by:   Cindee Salt MD   Signed by:   Cindee Salt MD on 11/06/2009   Method used:   Print then Give to Patient   RxID:   [id number]

## 2010-09-29 NOTE — Progress Notes (Signed)
Summary: lantus  Phone Note Refill Request Message from:  Fax from Pharmacy on August 07, 2010 9:43 AM  Refills Requested: Medication #1:  LANTUS SOLOSTAR 100 UNIT/ML SOLN take 20 units two times a day   Dosage confirmed as above?Dosage Confirmed   Last Refilled: 06/05/2010  Method Requested: Electronic Next Appointment Scheduled: 09/10/2010 Initial call taken by: Brenton Grills CMA Duncan Dull),  August 07, 2010 9:44 AM    Prescriptions: LANTUS SOLOSTAR 100 UNIT/ML SOLN (INSULIN GLARGINE) take 20 units two times a day  #1 month x 4   Entered by:   Brenton Grills CMA (AAMA)   Authorized by:   Minus Breeding MD   Signed by:   Brenton Grills CMA (AAMA) on 08/07/2010   Method used:   Electronically to        [facility]* (retail)       [address]       [place], [place]  [address]       Ph: [phone]       Fax: [phone]   RxID:   [id number]

## 2010-10-01 NOTE — Assessment & Plan Note (Signed)
Summary: POISON IVY OUT BREAK ON ARMS/JRR   Vital Signs:  Patient profile:   60 year old male Height:      75 inches Weight:      187.25 pounds BMI:     23.49 Temp:     98.3 degrees F oral Pulse rate:   80 / minute Pulse rhythm:   regular BP sitting:   126 / 72  (left arm) Cuff size:   regular  Vitals Entered By: Delilah Shan CMA Duncan Dull) (August 20, 2010 10:45 AM) CC: Poison Ivy outbreak on arms   History of Present Illness: Rash started last week.  had been working in yard.  This is typical for patient.  Prev had to take prednisone with relief.  Itching.  No FCNAV.  DM2 but sugar wasn't greatly changed on prednisone before.  Pre with lack of relief of topical tx.    His sugar has been running <200 in AM.    Allergies: 1)  ! Penicillin  Review of Systems       See HPI.  Otherwise negative.    Physical Exam  General:  no apparent distress regular rate and rhythm red blanching maculopapular rash with irregular border on bilateral arms, small patches on the trunk   Impression & Recommendations:  Problem # 1:  CONTACT DERMATITIS&OTHER ECZEMA DUE TO PLANTS (ICD-692.6) D/w patient re: glucose and steriods.  See instructions [id number] and follow up wtih endo as scheduled.  He agrees.  Call back as needed.  GI precautions given.  He has failed topical tx.  he is aware of risk of hyperglycemia with oral steroids.   The following medications were removed from the medication list:    Triamcinolone Acetonide 0.1 % Lotn (Triamcinolone acetonide) .Marland Kitchen... Apply three times a day as needed for itching His updated medication list for this problem includes:    Prednisone 20 Mg Tabs (Prednisone) .Marland Kitchen... 2 a day for 1 week then 1 a day for 1 week.  take with food  Orders: Prescription Created Electronically [phone])  Complete Medication List: 1)  Nitrostat 0.4 Mg Subl (Nitroglycerin) .... One tablet under the tongue every 5 minutes as needed for chest pain 2)  Accu-chek Aviva Strp  (Glucose blood) .... Check blood sugar four times a day 3)  Citalopram Hydrobromide 40 Mg Tabs (Citalopram hydrobromide) .Marland Kitchen.. 1 tab daily 4)  Centrum Silver Ultra Mens Tabs (Multiple vitamins-minerals) .... Take 1 by mouth once daily 5)  Aspir-low 81 Mg Tbec (Aspirin) .... Take 1 by mouth once daily 6)  Lantus Solostar 100 Unit/ml Soln (Insulin glargine) .... Take 20 units two times a day 7)  Novolog Flexpen 100 Unit/ml Soln (Insulin aspart) .... Take 4-6 units before meals once daily 8)  Simvastatin 10 Mg Tabs (Simvastatin) .... Take 1 by mouth once daily 9)  Prednisone 20 Mg Tabs (Prednisone) .... 2 a day for 1 week then 1 a day for 1 week.  take with food  Patient Instructions: 1)  Start the prednisone today and take it with food.   2)  If your AM sugar is >130, increase your lantus by 1 unit. 3)  If your AM sugar is <100, decrease your lantus by 1 unit. 4)  If your AM sugar is 101-129, don't change your dose.  5)  Take care.  Prescriptions: PREDNISONE 20 MG TABS (PREDNISONE) 2 a day for 1 week then 1 a day for 1 week.  take with food  #21 x 0   Entered and Authorized  by:   Crawford Givens MD   Signed by:   Crawford Givens MD on 08/20/2010   Method used:   Electronically to        [facility]* (retail)       [address]       [place], [place]  [address]       Ph: [phone]       Fax: [phone]   RxID:   [id number]    Orders Added: 1)  Est. Patient Level III [87564] 2)  Prescription Created Electronically [phone]

## 2010-11-16 LAB — URINALYSIS, ROUTINE W REFLEX MICROSCOPIC
Bilirubin Urine: NEGATIVE
Hgb urine dipstick: NEGATIVE
Ketones, ur: >80 mg/dL — AB
Nitrite: NEGATIVE
Protein, ur: 30 mg/dL — AB
Specific Gravity, Urine: 1.022 (ref 1.005–1.030)
Urobilinogen, UA: 0.2 mg/dL (ref 0.0–1.0)

## 2010-11-16 LAB — CBC
HCT: 51.8 % (ref 39.0–52.0)
Hemoglobin: 17.7 g/dL — ABNORMAL HIGH (ref 13.0–17.0)
MCV: 98.8 fL (ref 78.0–100.0)
Platelets: 250 10*3/uL (ref 150–400)
RDW: 12.3 % (ref 11.5–15.5)

## 2010-11-16 LAB — BASIC METABOLIC PANEL
BUN: 22 mg/dL (ref 6–23)
BUN: 24 mg/dL — ABNORMAL HIGH (ref 6–23)
BUN: 24 mg/dL — ABNORMAL HIGH (ref 6–23)
CO2: 21 mEq/L (ref 19–32)
CO2: 8 mEq/L — CL (ref 19–32)
Chloride: 103 mEq/L (ref 96–112)
Chloride: 103 mEq/L (ref 96–112)
Chloride: 94 mEq/L — ABNORMAL LOW (ref 96–112)
GFR calc Af Amer: 47 mL/min — ABNORMAL LOW (ref >60)
GFR calc Af Amer: >60 mL/min (ref >60)
GFR calc non Af Amer: 53 mL/min — ABNORMAL LOW (ref >60)
Glucose, Bld: 258 mg/dL — ABNORMAL HIGH (ref 70–99)
Potassium: 3.9 mEq/L (ref 3.5–5.1)
Potassium: 3.9 mEq/L (ref 3.5–5.1)
Potassium: 6.3 mEq/L (ref 3.5–5.1)
Sodium: 128 mEq/L — ABNORMAL LOW (ref 135–145)
Sodium: 129 mEq/L — ABNORMAL LOW (ref 135–145)
Sodium: 136 mEq/L (ref 135–145)

## 2010-11-16 LAB — URINE MICROSCOPIC-ADD ON

## 2010-11-16 LAB — DIFFERENTIAL
Basophils Absolute: 0.2 10*3/uL — ABNORMAL HIGH (ref 0.0–0.1)
Eosinophils Absolute: 0 10*3/uL (ref 0.0–0.7)
Eosinophils Relative: 0 % (ref 0–5)
Lymphs Abs: 0.6 10*3/uL — ABNORMAL LOW (ref 0.7–4.0)
Monocytes Absolute: 0.5 10*3/uL (ref 0.1–1.0)

## 2010-11-16 LAB — POCT I-STAT 3, VENOUS BLOOD GAS (G3P V)
Acid-base deficit: 20 mmol/L — ABNORMAL HIGH (ref 0.0–2.0)
O2 Saturation: 17 %
TCO2: 10 mmol/L (ref 0–100)
pCO2, Ven: 29.1 mmHg — ABNORMAL LOW (ref 45.0–50.0)

## 2010-11-16 LAB — GLUCOSE, CAPILLARY
Glucose-Capillary: 157 mg/dL — ABNORMAL HIGH (ref 70–99)
Glucose-Capillary: 235 mg/dL — ABNORMAL HIGH (ref 70–99)
Glucose-Capillary: 247 mg/dL — ABNORMAL HIGH (ref 70–99)
Glucose-Capillary: 364 mg/dL — ABNORMAL HIGH (ref 70–99)
Glucose-Capillary: 441 mg/dL — ABNORMAL HIGH (ref 70–99)
Glucose-Capillary: >600 mg/dL (ref 70–99)

## 2010-11-16 LAB — HEPATIC FUNCTION PANEL
ALT: 35 U/L (ref 0–53)
Alkaline Phosphatase: 180 U/L — ABNORMAL HIGH (ref 39–117)
Bilirubin, Direct: 0 mg/dL (ref 0.0–0.3)
Indirect Bilirubin: 0.6 mg/dL (ref 0.3–0.9)
Total Protein: 9.2 g/dL — ABNORMAL HIGH (ref 6.0–8.3)

## 2010-11-16 LAB — POCT CARDIAC MARKERS
CKMB, poc: 1.6 ng/mL (ref 1.0–8.0)
Myoglobin, poc: 63.7 ng/mL (ref 12–200)

## 2010-11-16 LAB — MRSA PCR SCREENING: MRSA by PCR: NEGATIVE

## 2010-11-16 LAB — MAGNESIUM: Magnesium: 1.7 mg/dL (ref 1.5–2.5)

## 2010-11-16 LAB — CARDIAC PANEL(CRET KIN+CKTOT+MB+TROPI)
CK, MB: 4.2 ng/mL — ABNORMAL HIGH (ref 0.3–4.0)
Relative Index: INVALID (ref 0.0–2.5)
Total CK: 45 U/L (ref 7–232)
Troponin I: 0.04 ng/mL (ref 0.00–0.06)

## 2010-11-16 LAB — HEMOCCULT GUIAC POC 1CARD (OFFICE): Fecal Occult Bld: NEGATIVE

## 2010-11-16 LAB — LIPASE, BLOOD: Lipase: 540 U/L — ABNORMAL HIGH (ref 23–300)

## 2010-11-19 LAB — CBC
HCT: 36.5 % — ABNORMAL LOW (ref 39.0–52.0)
Hemoglobin: 12.5 g/dL — ABNORMAL LOW (ref 13.0–17.0)
RBC: 3.73 MIL/uL — ABNORMAL LOW (ref 4.22–5.81)

## 2010-11-19 LAB — GLUCOSE, CAPILLARY
Glucose-Capillary: 104 mg/dL — ABNORMAL HIGH (ref 70–99)
Glucose-Capillary: 108 mg/dL — ABNORMAL HIGH (ref 70–99)
Glucose-Capillary: 120 mg/dL — ABNORMAL HIGH (ref 70–99)
Glucose-Capillary: 124 mg/dL — ABNORMAL HIGH (ref 70–99)
Glucose-Capillary: 135 mg/dL — ABNORMAL HIGH (ref 70–99)
Glucose-Capillary: 142 mg/dL — ABNORMAL HIGH (ref 70–99)
Glucose-Capillary: 169 mg/dL — ABNORMAL HIGH (ref 70–99)
Glucose-Capillary: 185 mg/dL — ABNORMAL HIGH (ref 70–99)
Glucose-Capillary: 205 mg/dL — ABNORMAL HIGH (ref 70–99)
Glucose-Capillary: 358 mg/dL — ABNORMAL HIGH (ref 70–99)
Glucose-Capillary: 93 mg/dL (ref 70–99)

## 2010-11-19 LAB — LIPID PANEL
HDL: 50 mg/dL (ref >39)
LDL Cholesterol: 146 mg/dL — ABNORMAL HIGH (ref 0–99)
Total CHOL/HDL Ratio: 4.1 RATIO
Triglycerides: 45 mg/dL (ref <150)
VLDL: 9 mg/dL (ref 0–40)

## 2010-11-19 LAB — BASIC METABOLIC PANEL
BUN: 12 mg/dL (ref 6–23)
BUN: 13 mg/dL (ref 6–23)
BUN: 14 mg/dL (ref 6–23)
BUN: 15 mg/dL (ref 6–23)
BUN: 18 mg/dL (ref 6–23)
BUN: 7 mg/dL (ref 6–23)
BUN: 8 mg/dL (ref 6–23)
CO2: 21 mEq/L (ref 19–32)
CO2: 23 mEq/L (ref 19–32)
CO2: 24 mEq/L (ref 19–32)
CO2: 24 mEq/L (ref 19–32)
CO2: 26 mEq/L (ref 19–32)
CO2: 29 mEq/L (ref 19–32)
Calcium: 11.2 mg/dL — ABNORMAL HIGH (ref 8.4–10.5)
Calcium: 11.3 mg/dL — ABNORMAL HIGH (ref 8.4–10.5)
Calcium: 11.8 mg/dL — ABNORMAL HIGH (ref 8.4–10.5)
Calcium: 11.9 mg/dL — ABNORMAL HIGH (ref 8.4–10.5)
Calcium: 11.9 mg/dL — ABNORMAL HIGH (ref 8.4–10.5)
Calcium: 11.9 mg/dL — ABNORMAL HIGH (ref 8.4–10.5)
Chloride: 103 mEq/L (ref 96–112)
Chloride: 104 mEq/L (ref 96–112)
Chloride: 105 mEq/L (ref 96–112)
Chloride: 107 mEq/L (ref 96–112)
Creatinine, Ser: 0.82 mg/dL (ref 0.4–1.5)
Creatinine, Ser: 0.87 mg/dL (ref 0.4–1.5)
Creatinine, Ser: 0.9 mg/dL (ref 0.4–1.5)
Creatinine, Ser: 0.9 mg/dL (ref 0.4–1.5)
Creatinine, Ser: 0.98 mg/dL (ref 0.4–1.5)
Creatinine, Ser: 1.03 mg/dL (ref 0.4–1.5)
GFR calc Af Amer: >60 mL/min (ref >60)
GFR calc Af Amer: >60 mL/min (ref >60)
GFR calc Af Amer: >60 mL/min (ref >60)
GFR calc Af Amer: >60 mL/min (ref >60)
GFR calc non Af Amer: >60 mL/min (ref >60)
GFR calc non Af Amer: >60 mL/min (ref >60)
GFR calc non Af Amer: >60 mL/min (ref >60)
GFR calc non Af Amer: >60 mL/min (ref >60)
GFR calc non Af Amer: >60 mL/min (ref >60)
GFR calc non Af Amer: >60 mL/min (ref >60)
GFR calc non Af Amer: >60 mL/min (ref >60)
Glucose, Bld: 111 mg/dL — ABNORMAL HIGH (ref 70–99)
Glucose, Bld: 155 mg/dL — ABNORMAL HIGH (ref 70–99)
Glucose, Bld: 210 mg/dL — ABNORMAL HIGH (ref 70–99)
Glucose, Bld: 210 mg/dL — ABNORMAL HIGH (ref 70–99)
Glucose, Bld: 233 mg/dL — ABNORMAL HIGH (ref 70–99)
Glucose, Bld: 237 mg/dL — ABNORMAL HIGH (ref 70–99)
Glucose, Bld: 264 mg/dL — ABNORMAL HIGH (ref 70–99)
Glucose, Bld: 343 mg/dL — ABNORMAL HIGH (ref 70–99)
Potassium: 2.9 mEq/L — ABNORMAL LOW (ref 3.5–5.1)
Potassium: 3.1 mEq/L — ABNORMAL LOW (ref 3.5–5.1)
Potassium: 3.4 mEq/L — ABNORMAL LOW (ref 3.5–5.1)
Potassium: 3.4 mEq/L — ABNORMAL LOW (ref 3.5–5.1)
Potassium: 3.5 mEq/L (ref 3.5–5.1)
Potassium: 3.6 mEq/L (ref 3.5–5.1)
Sodium: 130 mEq/L — ABNORMAL LOW (ref 135–145)
Sodium: 137 mEq/L (ref 135–145)
Sodium: 138 mEq/L (ref 135–145)

## 2010-11-19 LAB — CARDIAC PANEL(CRET KIN+CKTOT+MB+TROPI)
CK, MB: 4.2 ng/mL — ABNORMAL HIGH (ref 0.3–4.0)
Relative Index: INVALID (ref 0.0–2.5)
Total CK: 47 U/L (ref 7–232)
Troponin I: 0.01 ng/mL (ref 0.00–0.06)

## 2010-11-19 LAB — HEPATIC FUNCTION PANEL
AST: 18 U/L (ref 0–37)
Albumin: 3.3 g/dL — ABNORMAL LOW (ref 3.5–5.2)
Alkaline Phosphatase: 74 U/L (ref 39–117)
Bilirubin, Direct: 0.2 mg/dL (ref 0.0–0.3)
Total Bilirubin: 1 mg/dL (ref 0.3–1.2)

## 2010-11-19 LAB — MAGNESIUM: Magnesium: 1.7 mg/dL (ref 1.5–2.5)

## 2010-11-19 LAB — TSH: TSH: 1.168 u[IU]/mL (ref 0.350–4.500)

## 2010-11-19 LAB — PHOSPHORUS: Phosphorus: 1.6 mg/dL — ABNORMAL LOW (ref 2.3–4.6)

## 2010-11-19 LAB — ANA: Anti Nuclear Antibody(ANA): NEGATIVE

## 2011-01-12 NOTE — Consult Note (Signed)
[facility]                          ENDOCRINOLOGY CONSULTATION   NAME:[place], Shahmeer A                       MRN:          [id number]  DATE:02/13/2007                            DOB:          [birth year]    REASON FOR VISIT:  Follow up diabetes.   HISTORY OF THE PRESENT ILLNESS:  A 60 year old man who does not have a  home glucose record with him but states his glucoses are well controlled  except they are in the high 100's in the morning.   PAST MEDICAL HISTORY:  1. Cigarette smoker.  2. ED.   REVIEW OF SYSTEMS:  Occasional mild hypoglycemia before lunch.   PHYSICAL EXAMINATION:  Blood pressure 134/85, heart rate 68, temperature  99 degrees.  The weight is 216.  Insulin injection sites at the anterior  abdomen are normal.   LABORATORY STUDIES:  On February 13, 2007, hemoglobin A1c 9.6.   IMPRESSION:  Even though he is on a simpler insulin regimen now, he  appears to be struggling even with this regimen.  Expectations in terms  of his glycemic control will have to be modest in this situation.   PLAN:  1. Change Humalog 75/25 insulin to 11 units with breakfast and 6 units      with supper.  2. Return in 3 months.  3. He is advised to follow up with his eye doctor.     Sean A. Everardo All, MD  Electronically Signed    SAE/MedQ  DD: 02/16/2007  DT: 02/16/2007  Job #: [id number]   cc:   Karie Schwalbe, MD

## 2011-01-15 NOTE — Consult Note (Signed)
[facility]                          ENDOCRINOLOGY CONSULTATION   NAME:Jackson [name], Jackson [name]                       MRN:          [id number]  DATE:08/02/2006                            DOB:          [birth year]    Thursday, August 04, 2006   REASON FOR VISIT:  Follow-up diabetes.   HISTORY OF PRESENT ILLNESS:  [name] 60 year old man who did not return for  follow-up as advised.  He is taking Lantus 12 units [name] day and takes  Humalog only once or twice per week.  He is checking his glucose once or  twice per day but does not have any record of this.   Regarding his depression, he states that Wellbutrin did not  help.   SOCIAL HISTORY:  He states he has been extremely busy at work and has [name]  poor meal schedule.   REVIEW OF SYSTEMS:  Hypoglycemia once [name] month, no particular time of day  or situation.   PHYSICAL EXAMINATION:  GENERAL APPEARANCE:  No distress.  VITAL SIGNS:  Blood pressure 128/84, heart rate 81, temperature 99.4,  weight 218.  NEUROLOGIC:  He appears slightly anxious and depressed during this visit  today.   LABORATORY STUDIES:  On August 02, 2006, hemoglobin A1c 9.7.   IMPRESSION:  1. Diabetes is not well controlled due to nonadherence.  This could in      turn be due to #2.  2. Depression.   PLAN:  1. He is advised of the risk of nonadherence and the risk of      uncontrolled diabetes.  2. He is advised to see his eye doctor for follow-up.  3. Upon receiving this laboratory result, I will call him and offer      him [name] simpler regimen if that is all he feels he can do right now.      I will offer to change him to Humalog 75/25, 10 units every morning      and 5 units before supper with follow-up in [name] few weeks.  4. Please see Dr. Alphonsus Sias to follow up your depression, as this problem      may be underlying his poor control.     Sean [name]. Everardo All, MD  Electronically Signed   SAE/MedQ  DD: 08/04/2006  DT: 08/04/2006  Job #:  [id number]   cc:   Karie Schwalbe, MD

## 2011-01-15 NOTE — Consult Note (Signed)
[facility]                          ENDOCRINOLOGY CONSULTATION   NAME:Callegari, Tanav A                       MRN:          [id number]  DATE:11/23/2006                            DOB:          Nov 18, 1950    REASON FOR VISIT:  Follow-up diabetes.   HISTORY OF PRESENT ILLNESS:  This 60 year old man who now takes Lantus  10 units a day and Humalog 75/25 five units every morning.  He states  that he does not have any glucose record with him, but he states that,  in general, his glucoses run higher later in the day.   PAST MEDICAL HISTORY:  1. Cigarette smoker.  2. Recently seen by Dr. Alphonsus Sias to follow up his depression.   REVIEW OF SYSTEMS:  Denies hypoglycemia.   PHYSICAL EXAMINATION:  VITAL SIGNS:  Blood pressure 134/88, heart rate  71, temperature is 98.5.  Weight is to 222.  GENERAL:  In no distress.  He does not appear anxious nor depressed  today.   IMPRESSION:  In my opinion, he is better off with a simple regimen of  one type of insulin.   PLAN:  1. Discontinue Lantus.  2. Increase 75/25 insulin to 15 units every morning.  3. Return 30 days with a home glucose record.  4. Call for any hypoglycemia.     Sean A. Everardo All, MD  Electronically Signed    SAE/MedQ  DD: 11/24/2006  DT: 11/24/2006  Job #: [id number]

## 2011-04-29 ENCOUNTER — Other Ambulatory Visit: Payer: Self-pay | Admitting: *Deleted

## 2011-04-29 MED ORDER — SIMVASTATIN 10 MG PO TABS: 10.0000 mg | ORAL_TABLET | Freq: Every day | ORAL | Status: DC

## 2011-04-29 MED ORDER — CITALOPRAM HYDROBROMIDE 40 MG PO TABS: 40.0000 mg | ORAL_TABLET | Freq: Every day | ORAL | Status: DC

## 2011-04-29 MED ORDER — GLUCOSE BLOOD VI STRP: ORAL_STRIP | Status: AC

## 2011-04-29 NOTE — Telephone Encounter (Signed)
R'cd fax from [facility] for refill of pt's test strips  Last OV-06/11/2010  Last filled-01/13/2011

## 2011-04-29 NOTE — Telephone Encounter (Signed)
Citalopram is the highest recommended dose now I cannot increase We can add a low dose of another med to augment the effect of the citalopram but I really need to talk to him about this in the office

## 2011-04-29 NOTE — Telephone Encounter (Signed)
Pt requests a refill on citalopram, [facility] is supposed to fax request, but he is asking if he can increase the dose- thinks a higher dose will work better for him.  Please send in today, as he is going out of town tomorrow.

## 2011-04-29 NOTE — Telephone Encounter (Signed)
Spoke with patient and advised results rx sent to pharmacy by e-script  

## 2011-05-24 LAB — DIFFERENTIAL
Eosinophils Absolute: 0.1
Lymphs Abs: 2.3
Monocytes Relative: 5
Neutro Abs: 5
Neutrophils Relative %: 64

## 2011-05-24 LAB — POCT CARDIAC MARKERS
CKMB, poc: <1 — ABNORMAL LOW
Operator id: 277751
Troponin i, poc: <0.05

## 2011-05-24 LAB — CBC
Hemoglobin: 15.1
MCHC: 33.9
Platelets: 309
RDW: 12.8

## 2011-05-24 LAB — COMPREHENSIVE METABOLIC PANEL
ALT: 20
Albumin: 4.1
Calcium: 10.8 — ABNORMAL HIGH
Glucose, Bld: 227 — ABNORMAL HIGH
Sodium: 135
Total Protein: 6.6

## 2011-05-24 LAB — LIPASE, BLOOD: Lipase: 37

## 2011-07-14 ENCOUNTER — Telehealth: Payer: Self-pay | Admitting: *Deleted

## 2011-07-14 MED ORDER — INSULIN ASPART 100 UNIT/ML ~~LOC~~ SOLN: SUBCUTANEOUS | Status: DC

## 2011-07-14 MED ORDER — INSULIN GLARGINE 100 UNIT/ML ~~LOC~~ SOLN: 20.0000 [IU] | Freq: Two times a day (BID) | SUBCUTANEOUS | Status: DC

## 2011-07-14 NOTE — Telephone Encounter (Signed)
Pt left message requesting rx for insulin be sent to pharmacy and also to schedule an appointment for F/U. Appointment scheduled for 07/20/2011 8:00am. Rx for Lantus and Novolog sent to [facility].

## 2011-07-16 ENCOUNTER — Encounter: Payer: Self-pay | Admitting: Internal Medicine

## 2011-07-19 ENCOUNTER — Ambulatory Visit (INDEPENDENT_AMBULATORY_CARE_PROVIDER_SITE_OTHER): Payer: Federal, State, Local not specified - PPO | Admitting: Internal Medicine

## 2011-07-19 ENCOUNTER — Encounter: Payer: Self-pay | Admitting: Internal Medicine

## 2011-07-19 DIAGNOSIS — E785 Hyperlipidemia, unspecified: Secondary | ICD-10-CM

## 2011-07-19 DIAGNOSIS — F17200 Nicotine dependence, unspecified, uncomplicated: Secondary | ICD-10-CM | POA: Insufficient documentation

## 2011-07-19 DIAGNOSIS — F329 Major depressive disorder, single episode, unspecified: Secondary | ICD-10-CM

## 2011-07-19 DIAGNOSIS — F3289 Other specified depressive episodes: Secondary | ICD-10-CM

## 2011-07-19 DIAGNOSIS — E109 Type 1 diabetes mellitus without complications: Secondary | ICD-10-CM

## 2011-07-19 MED ORDER — CITALOPRAM HYDROBROMIDE 40 MG PO TABS: 40.0000 mg | ORAL_TABLET | Freq: Every day | ORAL | Status: DC

## 2011-07-19 MED ORDER — SIMVASTATIN 10 MG PO TABS: 10.0000 mg | ORAL_TABLET | Freq: Every day | ORAL | Status: DC

## 2011-07-19 NOTE — Assessment & Plan Note (Signed)
counseled Not ready to stop Gave info 1-800 STOP NOW

## 2011-07-19 NOTE — Progress Notes (Signed)
Subjective:    Patient ID: Jackson [name], male    DOB: [birth year], 60 y.o.   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 Daughter is doing better now  Ran out of citalopram in August Hasn't had it since Was doing okay at first Now notes some sleep problems and is teary at times  Has appt with Dr Everardo All tomorrow DM is "okay" Checks sugars 3 times a day and uses coverage sporadically Only uses the novolog once a day most days-- 3-4 units Discussed using some each meal and adjusting if high Only on lantus 20 Will often only eat once a day though Will let Dr Everardo All review this  Ran out of statin a couple of months ago No problems though  Current Outpatient Prescriptions on File Prior to Visit  Medication Sig Dispense Refill  . citalopram (CELEXA) 40 MG tablet Take 1 tablet (40 mg total) by mouth daily.  30 tablet  0  . glucose blood (ACCU-CHEK AVIVA PLUS) test strip Use as instructed to check blood sugar four times daily  120 each  1  . insulin aspart (NOVOLOG FLEXPEN) 100 UNIT/ML injection Take 4-6 units before meals once daily  15 mL  1  . insulin glargine (LANTUS SOLOSTAR) 100 UNIT/ML injection Inject 20 Units into the skin 2 (two) times daily.  15 mL  1  . simvastatin (ZOCOR) 10 MG tablet Take 1 tablet (10 mg total) by mouth at bedtime.  30 tablet  0    Allergies  Allergen Reactions  . Penicillins     Past Medical History  Diagnosis Date  . Diabetes mellitus   . ED (erectile dysfunction)   . Depression   . Hyperlipidemia   . Umbilical hemorrhage     No past surgical history on file.  Family History  Problem Relation Age of Onset  . COPD Mother   . Diabetes Father   . Diabetes Maternal Grandfather   . Diabetes Paternal Grandmother   . Cancer Neg Hx     NO PROSTATE OR COLON CANCER    History   Social History  . Marital Status: Divorced    Spouse Name: N/A    Number of Children:  2  . Years of Education: N/A   Occupational History  . RETIRED--POSTAL SERVICE    Social History Main Topics  . Smoking status: Current Everyday Smoker  . Smokeless tobacco: Never Used  . Alcohol Use: Yes  . Drug Use: No  . Sexually Active: Not on file   Other Topics Concern  . Not on file   Social History Narrative  . No narrative on file   Review of Systems Weight is fairly stable Will fall asleep after dinner--wake up hours later, then can't sleep at night    Objective:   Physical Exam  Constitutional: He is oriented to person, place, and time. He appears well-developed and well-nourished. No distress.  Neck: Normal range of motion. Neck supple. No thyromegaly present.  Cardiovascular: Normal rate, regular rhythm, normal heart sounds and intact distal pulses.  Exam reveals no gallop.   No murmur heard. Pulmonary/Chest: Effort normal and breath sounds normal. No respiratory distress. He has no wheezes. He has no rales.  Abdominal: Soft. There is no tenderness.  Musculoskeletal: He exhibits no edema and no tenderness.  Lymphadenopathy:    He has no cervical adenopathy.  Neurological: He is alert and oriented to person, place, and time.  Normal sensation in feet  Skin:       No foot lesions  Psychiatric: He has a normal mood and affect. His behavior is normal. Judgment and thought content normal.          Assessment & Plan:

## 2011-07-19 NOTE — Assessment & Plan Note (Signed)
No problems with the med Will restart and check level at next visit

## 2011-07-19 NOTE — Assessment & Plan Note (Signed)
Still seems to have poor control Only eats once a day Inconsistent with novolog use Seeing Dr Everardo All tomorrow Will defer blood work to him

## 2011-07-19 NOTE — Assessment & Plan Note (Signed)
Ran out of meds No with increase sad feelings and crying  Sleep is now affected Will restart the citalopram

## 2011-07-20 ENCOUNTER — Ambulatory Visit: Payer: Self-pay | Admitting: Endocrinology

## 2011-07-20 DIAGNOSIS — Z0289 Encounter for other administrative examinations: Secondary | ICD-10-CM

## 2011-11-22 ENCOUNTER — Ambulatory Visit (INDEPENDENT_AMBULATORY_CARE_PROVIDER_SITE_OTHER): Payer: Federal, State, Local not specified - PPO | Admitting: Endocrinology

## 2011-11-22 ENCOUNTER — Other Ambulatory Visit (INDEPENDENT_AMBULATORY_CARE_PROVIDER_SITE_OTHER): Payer: Federal, State, Local not specified - PPO

## 2011-11-22 ENCOUNTER — Encounter: Payer: Self-pay | Admitting: Endocrinology

## 2011-11-22 VITALS — BP 134/82 | HR 75 | Temp 98.5°F | Ht 75.0 in | Wt 175.1 lb

## 2011-11-22 DIAGNOSIS — E109 Type 1 diabetes mellitus without complications: Secondary | ICD-10-CM

## 2011-11-22 NOTE — Progress Notes (Signed)
  Subjective:    Patient ID: Jackson [name], male    DOB: [birth year], 61 y.o.   MRN: [id number]  HPI Pt returns for f/u of insulin-requiring DM (2006).  pt states he feels well in general.  He is overdue for f/u appt.  He has hypoglycemia approx 1/week. He says this usually happens when a meal is missed or delayed.  He takes lantus 20/day, and prn novolog (averages approx 6 units total per day).  Past Medical History  Diagnosis Date  . Diabetes mellitus   . ED (erectile dysfunction)   . Depression   . Hyperlipidemia   . Umbilical hemorrhage     No past surgical history on file.  History   Social History  . Marital Status: Divorced    Spouse Name: N/A    Number of Children: 2  . Years of Education: N/A   Occupational History  . RETIRED--POSTAL SERVICE    Social History Main Topics  . Smoking status: Current Everyday Smoker  . Smokeless tobacco: Never Used  . Alcohol Use: Yes  . Drug Use: No  . Sexually Active: Not on file   Other Topics Concern  . Not on file   Social History Narrative  . No narrative on file    Current Outpatient Prescriptions on File Prior to Visit  Medication Sig Dispense Refill  . citalopram (CELEXA) 40 MG tablet Take 1 tablet (40 mg total) by mouth daily.  30 tablet  11  . glucose blood (ACCU-CHEK AVIVA PLUS) test strip Use as instructed to check blood sugar four times daily  120 each  1  . insulin aspart (NOVOLOG FLEXPEN) 100 UNIT/ML injection Take 4-6 units before meals once daily  15 mL  1  . insulin glargine (LANTUS SOLOSTAR) 100 UNIT/ML injection Inject 20 Units into the skin 2 (two) times daily.  15 mL  1  . simvastatin (ZOCOR) 10 MG tablet Take 1 tablet (10 mg total) by mouth at bedtime.  30 tablet  11    Allergies  Allergen Reactions  . Penicillins     Family History  Problem Relation Age of Onset  . COPD Mother   . Diabetes Father   . Diabetes Maternal Grandfather   . Diabetes Paternal Grandmother   . Cancer Neg Hx     NO  PROSTATE OR COLON CANCER    BP 134/82  Pulse 75  Temp(Src) 98.5 F (36.9 C) (Oral)  Ht 6\' 3"  (1.905 m)  Wt 175 lb 1.9 oz (79.434 kg)  BMI 21.89 kg/m2  SpO2 98%    Review of Systems denies hypoglycemia    Objective:   Physical Exam VITAL SIGNS:  See vs page GENERAL: no distress Pulses: dorsalis pedis intact bilat.   Feet: no deformity.  no ulcer on the feet.  feet are of normal color and temp.  no edema Neuro: sensation is intact to touch on the feet.     Lab Results  Component Value Date   HGBA1C 11.8* 11/22/2011      Assessment & Plan:  DM, therapy limited by noncompliance.  i'll do the best i can.

## 2011-11-22 NOTE — Patient Instructions (Addendum)
good diet and exercise habits significanly improve the control of your diabetes.  please let me know if you wish to be referred to a dietician.  high blood sugar is very risky to your health.  you should see an eye doctor every year. controlling your blood pressure and cholesterol drastically reduces the damage diabetes does to your body.  this also applies to quitting smoking.  please discuss these with your doctor.  you should take an aspirin every day, unless you have been advised by a doctor not to. check your blood sugar 2 times a day.  vary the time of day when you check, between before the 3 meals, and at bedtime.  also check if you have symptoms of your blood sugar being too high or too low.  please keep a record of the readings and bring it to your next appointment here.  please call us sooner if your blood sugar goes below 70, or if it stays over 200.  On this type of insulin schedule, meals should not be missed or delayed.   blood tests are being requested for you today.  You will receive a letter with results. Please also see dr Alphonsus Sias as scheduled in may.   (see letter)

## 2011-11-23 ENCOUNTER — Telehealth: Payer: Self-pay | Admitting: *Deleted

## 2011-11-23 NOTE — Telephone Encounter (Signed)
Called pt to inform of A1c results, pt informed. (Letter also mailed to pt)

## 2011-12-04 ENCOUNTER — Other Ambulatory Visit: Payer: Self-pay | Admitting: Endocrinology

## 2011-12-27 ENCOUNTER — Ambulatory Visit (INDEPENDENT_AMBULATORY_CARE_PROVIDER_SITE_OTHER): Payer: Federal, State, Local not specified - PPO | Admitting: Internal Medicine

## 2011-12-27 ENCOUNTER — Encounter: Payer: Self-pay | Admitting: Internal Medicine

## 2011-12-27 VITALS — BP 120/60 | HR 78 | Temp 97.8°F | Wt 185.0 lb

## 2011-12-27 DIAGNOSIS — L255 Unspecified contact dermatitis due to plants, except food: Secondary | ICD-10-CM

## 2011-12-27 DIAGNOSIS — L237 Allergic contact dermatitis due to plants, except food: Secondary | ICD-10-CM | POA: Insufficient documentation

## 2011-12-27 MED ORDER — PREDNISONE 20 MG PO TABS: 40.0000 mg | ORAL_TABLET | Freq: Every day | ORAL | Status: AC

## 2011-12-27 NOTE — Patient Instructions (Signed)
Please call if the redness on your inside left arm gets worse on the prednisone---especially if warm and tender. We will treat with antibiotic then

## 2011-12-27 NOTE — Assessment & Plan Note (Signed)
Will treat with prednisone Continue symptomatic topical care If left arm site worsens, will treat with antibiotic

## 2011-12-27 NOTE — Progress Notes (Signed)
  Subjective:    Patient ID: [name], male    DOB: 02/09/1951, 61 y.o.   MRN: [id number]  HPI Is interested in stopping smoking Gave 1-800 QUIT NOW info  Poked left arm with branch 4 days ago Has rash around the spot Notices some hardness there---not overly tender  Has rash on arms---typical contact rash Came on 2 days after exposure  Tried peroxide Used my Rx cortisone cream and rubbing alcohol also  Current Outpatient Prescriptions on File Prior to Visit  Medication Sig Dispense Refill  . citalopram (CELEXA) 40 MG tablet Take 1 tablet (40 mg total) by mouth daily.  30 tablet  11  . glucose blood (ACCU-CHEK AVIVA PLUS) test strip Use as instructed to check blood sugar four times daily  120 each  1  . insulin aspart (NOVOLOG FLEXPEN) 100 UNIT/ML injection Take 4-6 units before meals once daily  15 mL  1  . insulin glargine (LANTUS SOLOSTAR) 100 UNIT/ML injection Inject 22 Units into the skin 2 (two) times daily.  15 mL  3  . insulin glargine (LANTUS) 100 UNIT/ML injection Inject 22 Units into the skin 2 (two) times daily.      . simvastatin (ZOCOR) 10 MG tablet Take 1 tablet (10 mg total) by mouth at bedtime.  30 tablet  11    Allergies  Allergen Reactions  . Penicillins     Past Medical History  Diagnosis Date  . Diabetes mellitus   . ED (erectile dysfunction)   . Depression   . Hyperlipidemia   . Umbilical hemorrhage     No past surgical history on file.  Family History  Problem Relation Age of Onset  . COPD Mother   . Diabetes Father   . Diabetes Maternal Grandfather   . Diabetes Paternal Grandmother   . Cancer Neg Hx     NO PROSTATE OR COLON CANCER    History   Social History  . Marital Status: Divorced    Spouse Name: N/A    Number of Children: 2  . Years of Education: N/A   Occupational History  . RETIRED--POSTAL SERVICE    Social History Main Topics  . Smoking status: Current Everyday Smoker  . Smokeless tobacco: Never Used  . Alcohol  Use: Yes  . Drug Use: No  . Sexually Active: Not on file   Other Topics Concern  . Not on file   Social History Narrative  . No narrative on file   Review of Systems No fever Some leakage from left arm wound     Objective:   Physical Exam  Skin:       Scab over 2 small puncture sites on volar left arm (just above elbow) Surrounding rash which is not warm or tender  Linear areas of red rash scattered on arms bilat          Assessment & Plan:

## 2012-01-17 ENCOUNTER — Ambulatory Visit (INDEPENDENT_AMBULATORY_CARE_PROVIDER_SITE_OTHER): Payer: Federal, State, Local not specified - PPO | Admitting: Internal Medicine

## 2012-01-17 ENCOUNTER — Encounter: Payer: Self-pay | Admitting: Internal Medicine

## 2012-01-17 VITALS — BP 120/72 | HR 97 | Temp 98.1°F | Ht 75.0 in | Wt 180.0 lb

## 2012-01-17 DIAGNOSIS — Z1211 Encounter for screening for malignant neoplasm of colon: Secondary | ICD-10-CM

## 2012-01-17 DIAGNOSIS — E109 Type 1 diabetes mellitus without complications: Secondary | ICD-10-CM

## 2012-01-17 DIAGNOSIS — F329 Major depressive disorder, single episode, unspecified: Secondary | ICD-10-CM

## 2012-01-17 DIAGNOSIS — E785 Hyperlipidemia, unspecified: Secondary | ICD-10-CM

## 2012-01-17 DIAGNOSIS — Z0000 Encounter for general adult medical examination without abnormal findings: Secondary | ICD-10-CM

## 2012-01-17 LAB — BASIC METABOLIC PANEL
BUN: 15 mg/dL (ref 6–23)
Calcium: 10.8 mg/dL — ABNORMAL HIGH (ref 8.4–10.5)
GFR: 107.24 mL/min (ref >60.00)
Glucose, Bld: 251 mg/dL — ABNORMAL HIGH (ref 70–99)

## 2012-01-17 LAB — CBC WITH DIFFERENTIAL/PLATELET
Basophils Absolute: 0 10*3/uL (ref 0.0–0.1)
HCT: 42 % (ref 39.0–52.0)
Lymphocytes Relative: 26.9 % (ref 12.0–46.0)
Lymphs Abs: 1.4 10*3/uL (ref 0.7–4.0)
Monocytes Relative: 7.3 % (ref 3.0–12.0)
Platelets: 228 10*3/uL (ref 150.0–400.0)
RDW: 14.1 % (ref 11.5–14.6)

## 2012-01-17 LAB — LIPID PANEL
Cholesterol: 181 mg/dL (ref 0–200)
HDL: 65.5 mg/dL (ref >39.00)
LDL Cholesterol: 107 mg/dL — ABNORMAL HIGH (ref 0–99)
Triglycerides: 45 mg/dL (ref 0.0–149.0)
VLDL: 9 mg/dL (ref 0.0–40.0)

## 2012-01-17 LAB — HEPATIC FUNCTION PANEL
Albumin: 3.7 g/dL (ref 3.5–5.2)
Alkaline Phosphatase: 116 U/L (ref 39–117)

## 2012-01-17 NOTE — Assessment & Plan Note (Signed)
Generally healthy Starting to walk again Will do PSA after discussion Stool immunoassay

## 2012-01-17 NOTE — Assessment & Plan Note (Signed)
Poor control Asked him to add novolog before snack and main meal also

## 2012-01-17 NOTE — Progress Notes (Signed)
Subjective:    Patient ID: Jackson [name], male    DOB: 03-09-1951, 61 y.o.   MRN: [id number]  HPI Here for physical  Rash is gone No new concerns  Did increase lantus with last poor A1c Not testing regularly Doesn't use the novolog No neuroglycopenia Typically will have 1 large meal a day and snack at other times  Feels depression is managed. Not anhedonic-- enjoys meeting with friends (1-2 times per week), enjoys food, reading, etc  Still smoking Not ready to quit now Counseled Has info on 1-800 QUIT NOW program  Current Outpatient Prescriptions on File Prior to Visit  Medication Sig Dispense Refill  . citalopram (CELEXA) 40 MG tablet Take 1 tablet (40 mg total) by mouth daily.  30 tablet  11  . glucose blood (ACCU-CHEK AVIVA PLUS) test strip Use as instructed to check blood sugar four times daily  120 each  1  . insulin aspart (NOVOLOG FLEXPEN) 100 UNIT/ML injection Take 4-6 units before meals once daily  15 mL  1  . insulin glargine (LANTUS SOLOSTAR) 100 UNIT/ML injection Inject 22 Units into the skin 2 (two) times daily.  15 mL  3  . simvastatin (ZOCOR) 10 MG tablet Take 1 tablet (10 mg total) by mouth at bedtime.  30 tablet  11  . DISCONTD: insulin glargine (LANTUS) 100 UNIT/ML injection Inject 22 Units into the skin 2 (two) times daily.        Allergies  Allergen Reactions  . Penicillins     Past Medical History  Diagnosis Date  . Diabetes mellitus   . ED (erectile dysfunction)   . Depression   . Hyperlipidemia   . Umbilical hemorrhage     No past surgical history on file.  Family History  Problem Relation Age of Onset  . COPD Mother   . Diabetes Father   . Diabetes Maternal Grandfather   . Diabetes Paternal Grandmother   . Cancer Neg Hx     NO PROSTATE OR COLON CANCER    History   Social History  . Marital Status: Divorced    Spouse Name: N/A    Number of Children: 2  . Years of Education: N/A   Occupational History  . RETIRED--POSTAL  SERVICE    Social History Main Topics  . Smoking status: Current Everyday Smoker  . Smokeless tobacco: Never Used  . Alcohol Use: Yes  . Drug Use: No  . Sexually Active: Not on file   Other Topics Concern  . Not on file   Social History Narrative  . No narrative on file   Review of Systems  Constitutional: Negative for fatigue and unexpected weight change.       Wears seat belt  HENT: Negative for hearing loss, congestion, rhinorrhea, dental problem and tinnitus.        Has been going to dentist  Eyes: Negative for visual disturbance.       Keeps up with eye doctor---no diabetic retinopathy  Respiratory: Negative for cough, chest tightness and shortness of breath.   Cardiovascular: Negative for chest pain, palpitations and leg swelling.  Gastrointestinal: Negative for nausea, vomiting, abdominal pain, constipation and blood in stool.       No heartburn  Genitourinary: Negative for urgency, frequency and difficulty urinating.       Notes mild dribbling No sexual problems---alone for 5 years  Musculoskeletal: Negative for back pain, joint swelling and arthralgias.  Skin: Negative for rash.       Small red  spots on arms  Neurological: Negative for dizziness, syncope, weakness, light-headedness, numbness and headaches.  Hematological: Negative for adenopathy. Does not bruise/bleed easily.  Psychiatric/Behavioral: Positive for dysphoric mood. Negative for sleep disturbance. The patient is not nervous/anxious.        Objective:   Physical Exam  Constitutional: He is oriented to person, place, and time. He appears well-developed and well-nourished. No distress.  HENT:  Head: Normocephalic and atraumatic.  Right Ear: External ear normal.  Left Ear: External ear normal.  Mouth/Throat: Oropharynx is clear and moist. No oropharyngeal exudate.  Eyes: Conjunctivae and EOM are normal. Pupils are equal, round, and reactive to light.  Neck: Normal range of motion. Neck supple. No  thyromegaly present.  Cardiovascular: Normal rate, normal heart sounds and intact distal pulses.  Exam reveals no gallop.   No murmur heard. Pulmonary/Chest: Effort normal and breath sounds normal. No respiratory distress. He has no wheezes. He has no rales.  Abdominal: Soft. There is no tenderness.  Musculoskeletal: Normal range of motion. He exhibits no edema and no tenderness.  Lymphadenopathy:    He has no cervical adenopathy.  Neurological: He is alert and oriented to person, place, and time.  Skin: No rash noted. No erythema.       Benign nevi and keratoses  Psychiatric: He has a normal mood and affect. His behavior is normal. Thought content normal.          Assessment & Plan:

## 2012-01-17 NOTE — Assessment & Plan Note (Signed)
Chronic but reasonably controlled for him Not anhedonic Needs to continue med

## 2012-01-17 NOTE — Patient Instructions (Signed)
Please use novolog 2 units before snacks and 6 units before your main meal

## 2012-01-17 NOTE — Assessment & Plan Note (Signed)
Due for labs

## 2012-01-18 LAB — TSH: TSH: 1.03 u[IU]/mL (ref 0.35–5.50)

## 2012-01-26 ENCOUNTER — Other Ambulatory Visit: Payer: Federal, State, Local not specified - PPO

## 2012-01-26 DIAGNOSIS — Z1211 Encounter for screening for malignant neoplasm of colon: Secondary | ICD-10-CM

## 2012-01-26 LAB — FECAL OCCULT BLOOD, IMMUNOCHEMICAL: Fecal Occult Bld: NEGATIVE

## 2012-01-28 ENCOUNTER — Encounter: Payer: Self-pay | Admitting: *Deleted

## 2012-02-16 ENCOUNTER — Other Ambulatory Visit: Payer: Self-pay | Admitting: Internal Medicine

## 2012-02-16 DIAGNOSIS — R748 Abnormal levels of other serum enzymes: Secondary | ICD-10-CM

## 2012-02-22 ENCOUNTER — Other Ambulatory Visit: Payer: Federal, State, Local not specified - PPO

## 2012-04-23 ENCOUNTER — Inpatient Hospital Stay: Payer: Self-pay | Admitting: Internal Medicine

## 2012-04-23 LAB — BASIC METABOLIC PANEL
Anion Gap: 13 (ref 7–16)
BUN: 25 mg/dL — ABNORMAL HIGH (ref 7–18)
Calcium, Total: 10.3 mg/dL — ABNORMAL HIGH (ref 8.5–10.1)
Calcium, Total: 10.4 mg/dL — ABNORMAL HIGH (ref 8.5–10.1)
Chloride: 108 mmol/L — ABNORMAL HIGH (ref 98–107)
Chloride: 110 mmol/L — ABNORMAL HIGH (ref 98–107)
EGFR (African American): >60
EGFR (Non-African Amer.): 44 — ABNORMAL LOW
EGFR (Non-African Amer.): 53 — ABNORMAL LOW
Glucose: 291 mg/dL — ABNORMAL HIGH (ref 65–99)
Glucose: 144 mg/dL — ABNORMAL HIGH (ref 65–99)
Osmolality: 293 (ref 275–301)
Osmolality: 282 (ref 275–301)
Potassium: 4 mmol/L (ref 3.5–5.1)
Potassium: 4.1 mmol/L (ref 3.5–5.1)
Sodium: 138 mmol/L (ref 136–145)

## 2012-04-23 LAB — URINALYSIS, COMPLETE
Bacteria: NONE SEEN
Bilirubin,UR: NEGATIVE
Glucose,UR: >=500 mg/dL (ref 0–75)
Hyaline Cast: 4
Leukocyte Esterase: NEGATIVE
Protein: NEGATIVE
RBC,UR: NONE SEEN /HPF (ref 0–5)
Squamous Epithelial: NONE SEEN

## 2012-04-23 LAB — TROPONIN I: Troponin-I: <0.02 ng/mL

## 2012-04-23 LAB — COMPREHENSIVE METABOLIC PANEL
BUN: 29 mg/dL — ABNORMAL HIGH (ref 7–18)
Chloride: 93 mmol/L — ABNORMAL LOW (ref 98–107)
EGFR (African American): 38 — ABNORMAL LOW
EGFR (Non-African Amer.): 33 — ABNORMAL LOW
Glucose: 692 mg/dL (ref 65–99)
Osmolality: 301 (ref 275–301)
SGOT(AST): 25 U/L (ref 15–37)
SGPT (ALT): 49 U/L (ref 12–78)
Total Protein: 8.1 g/dL (ref 6.4–8.2)

## 2012-04-23 LAB — CBC
HCT: 49.1 % (ref 40.0–52.0)
HGB: 15.4 g/dL (ref 13.0–18.0)
MCH: 32.7 pg (ref 26.0–34.0)
MCHC: 31.3 g/dL — ABNORMAL LOW (ref 32.0–36.0)
MCV: 105 fL — ABNORMAL HIGH (ref 80–100)
Platelet: 307 10*3/uL (ref 150–440)
RDW: 13.5 % (ref 11.5–14.5)

## 2012-04-24 LAB — BASIC METABOLIC PANEL
Anion Gap: 9 (ref 7–16)
Calcium, Total: 11 mg/dL — ABNORMAL HIGH (ref 8.5–10.1)
Chloride: 107 mmol/L (ref 98–107)
Co2: 20 mmol/L — ABNORMAL LOW (ref 21–32)
Co2: 22 mmol/L (ref 21–32)
Creatinine: 1.07 mg/dL (ref 0.60–1.30)
EGFR (African American): >60
EGFR (Non-African Amer.): >60
Glucose: 135 mg/dL — ABNORMAL HIGH (ref 65–99)
Osmolality: 282 (ref 275–301)
Potassium: 4.1 mmol/L (ref 3.5–5.1)
Sodium: 136 mmol/L (ref 136–145)

## 2012-04-24 LAB — CBC WITH DIFFERENTIAL/PLATELET
Basophil #: 0 10*3/uL (ref 0.0–0.1)
Basophil %: 0.1 %
Eosinophil #: 0 10*3/uL (ref 0.0–0.7)
HGB: 12.2 g/dL — ABNORMAL LOW (ref 13.0–18.0)
Lymphocyte #: 1.6 10*3/uL (ref 1.0–3.6)
Lymphocyte %: 8.7 %
MCH: 32.5 pg (ref 26.0–34.0)
MCHC: 33.4 g/dL (ref 32.0–36.0)
MCV: 98 fL (ref 80–100)
Monocyte #: 1.4 x10 3/mm — ABNORMAL HIGH (ref 0.2–1.0)
Neutrophil %: 83.7 %
RDW: 12.8 % (ref 11.5–14.5)

## 2012-08-02 ENCOUNTER — Other Ambulatory Visit: Payer: Self-pay

## 2012-08-02 MED ORDER — CITALOPRAM HYDROBROMIDE 40 MG PO TABS: 40.0000 mg | ORAL_TABLET | Freq: Every day | ORAL | Status: DC

## 2012-08-02 NOTE — Telephone Encounter (Signed)
Pt request refill citalopram # 30 x 6 to [facility] pharmacy. Pt notified done.

## 2012-09-05 ENCOUNTER — Other Ambulatory Visit: Payer: Self-pay | Admitting: *Deleted

## 2012-09-05 MED ORDER — INSULIN GLARGINE 100 UNIT/ML ~~LOC~~ SOLN: 22.0000 [IU] | Freq: Two times a day (BID) | SUBCUTANEOUS | Status: DC

## 2012-09-05 MED ORDER — GLUCOSE BLOOD VI STRP: ORAL_STRIP | Status: DC

## 2013-01-30 ENCOUNTER — Emergency Department: Payer: Self-pay | Admitting: Emergency Medicine

## 2013-01-30 LAB — ETHANOL: Ethanol %: 0.225 % — ABNORMAL HIGH (ref 0.000–0.080)

## 2013-01-30 LAB — COMPREHENSIVE METABOLIC PANEL
Alkaline Phosphatase: 117 U/L (ref 50–136)
Anion Gap: 9 (ref 7–16)
Calcium, Total: 10.1 mg/dL (ref 8.5–10.1)
Chloride: 104 mmol/L (ref 98–107)
Co2: 22 mmol/L (ref 21–32)
EGFR (African American): >60
EGFR (Non-African Amer.): >60
Osmolality: 287 (ref 275–301)
Potassium: 4.1 mmol/L (ref 3.5–5.1)
SGOT(AST): 27 U/L (ref 15–37)
SGPT (ALT): 38 U/L (ref 12–78)
Total Protein: 6.8 g/dL (ref 6.4–8.2)

## 2013-01-30 LAB — CBC
HCT: 41.1 % (ref 40.0–52.0)
HGB: 13.8 g/dL (ref 13.0–18.0)
MCV: 98 fL (ref 80–100)
Platelet: 237 10*3/uL (ref 150–440)
RDW: 13.6 % (ref 11.5–14.5)
WBC: 6.6 10*3/uL (ref 3.8–10.6)

## 2013-02-20 ENCOUNTER — Telehealth: Payer: Self-pay | Admitting: *Deleted

## 2013-02-20 NOTE — Telephone Encounter (Signed)
Message copied by Sueanne Margarita on Tue Feb 20, 2013 11:32 AM ------      Message from: Tillman Abide I      Created: Tue Feb 20, 2013  7:21 AM       Were these just future orders---they were supposed to be done a year ago but I haven't seen him since then      Can you call and see if he is coming in for this and he is overdue for visit            Rich      ----- Message -----         From: SYSTEM         Sent: 02/20/2013  12:06 AM           To: Karie Schwalbe, MD                   ------

## 2013-02-20 NOTE — Telephone Encounter (Signed)
Spoke with patient and advised results CPX appt made

## 2013-03-01 ENCOUNTER — Other Ambulatory Visit: Payer: Self-pay | Admitting: *Deleted

## 2013-03-01 MED ORDER — GLUCOSE BLOOD VI STRP: ORAL_STRIP | Status: DC

## 2013-03-26 ENCOUNTER — Encounter: Payer: Self-pay | Admitting: Internal Medicine

## 2013-03-26 ENCOUNTER — Ambulatory Visit (INDEPENDENT_AMBULATORY_CARE_PROVIDER_SITE_OTHER): Payer: Federal, State, Local not specified - PPO | Admitting: Internal Medicine

## 2013-03-26 VITALS — BP 110/80 | HR 77 | Temp 98.6°F | Wt 177.0 lb

## 2013-03-26 DIAGNOSIS — F17200 Nicotine dependence, unspecified, uncomplicated: Secondary | ICD-10-CM

## 2013-03-26 DIAGNOSIS — Z1211 Encounter for screening for malignant neoplasm of colon: Secondary | ICD-10-CM

## 2013-03-26 DIAGNOSIS — R7989 Other specified abnormal findings of blood chemistry: Secondary | ICD-10-CM

## 2013-03-26 DIAGNOSIS — E1049 Type 1 diabetes mellitus with other diabetic neurological complication: Secondary | ICD-10-CM

## 2013-03-26 DIAGNOSIS — E785 Hyperlipidemia, unspecified: Secondary | ICD-10-CM

## 2013-03-26 DIAGNOSIS — Z0000 Encounter for general adult medical examination without abnormal findings: Secondary | ICD-10-CM

## 2013-03-26 DIAGNOSIS — F339 Major depressive disorder, recurrent, unspecified: Secondary | ICD-10-CM

## 2013-03-26 DIAGNOSIS — E1065 Type 1 diabetes mellitus with hyperglycemia: Secondary | ICD-10-CM

## 2013-03-26 LAB — LIPID PANEL
Total CHOL/HDL Ratio: 4
VLDL: 20.2 mg/dL (ref 0.0–40.0)

## 2013-03-26 LAB — BASIC METABOLIC PANEL
CO2: 29 mEq/L (ref 19–32)
Calcium: 11.4 mg/dL — ABNORMAL HIGH (ref 8.4–10.5)
Creatinine, Ser: 0.8 mg/dL (ref 0.4–1.5)
GFR: 103.75 mL/min (ref >60.00)
Glucose, Bld: 241 mg/dL — ABNORMAL HIGH (ref 70–99)
Sodium: 136 mEq/L (ref 135–145)

## 2013-03-26 LAB — CBC WITH DIFFERENTIAL/PLATELET
Basophils Absolute: 0 10*3/uL (ref 0.0–0.1)
Eosinophils Absolute: 0.1 10*3/uL (ref 0.0–0.7)
Lymphocytes Relative: 32.8 % (ref 12.0–46.0)
MCHC: 33.6 g/dL (ref 30.0–36.0)
Monocytes Relative: 7.4 % (ref 3.0–12.0)
Neutrophils Relative %: 57.1 % (ref 43.0–77.0)
RBC: 4.44 Mil/uL (ref 4.22–5.81)
RDW: 13.7 % (ref 11.5–14.6)

## 2013-03-26 LAB — MICROALBUMIN / CREATININE URINE RATIO
Microalb Creat Ratio: 1 mg/g (ref 0.0–30.0)
Microalb, Ur: 2.3 mg/dL — ABNORMAL HIGH (ref 0.0–1.9)

## 2013-03-26 LAB — HEPATIC FUNCTION PANEL
Alkaline Phosphatase: 85 U/L (ref 39–117)
Bilirubin, Direct: 0.1 mg/dL (ref 0.0–0.3)
Total Bilirubin: 0.8 mg/dL (ref 0.3–1.2)

## 2013-03-26 LAB — TSH: TSH: 2.17 u[IU]/mL (ref 0.35–5.50)

## 2013-03-26 NOTE — Progress Notes (Signed)
  Subjective:    Patient ID: Jackson [name], male    DOB: Jul 26, 1951, 62 y.o.   MRN: [id number]  HPI    Review of Systems     Objective:   Physical Exam  Musculoskeletal:  Mild pain and decreased ROM with external rotation of left shoulder          Assessment & Plan:

## 2013-03-26 NOTE — Assessment & Plan Note (Signed)
Slightly better with his regimen but still probably not good If A1c still over 9%, will set up with Dr Elvera Lennox

## 2013-03-26 NOTE — Progress Notes (Signed)
Subjective:    Patient ID: Jackson [name], male    DOB: August 19, 1951, 62 y.o.   MRN: [id number]  HPI Here for physical  Still smoking--- not interested in stopping right now Daughter tried wellbutrin He doesn't want to try any meds  Checks sugars 2--3 times per day Still not using novolog before every meal--only the larger ones lantus ~20bid Fasting 80-260 Has had some hypoglycemic reactions---usually daytime if he misses a meal Due for eye exam  Stopped the cholesterol medication Didn't come back for repeat liver tests--- but did stop the statin  Depression has been controlled  Current Outpatient Prescriptions on File Prior to Visit  Medication Sig Dispense Refill  . citalopram (CELEXA) 40 MG tablet Take 1 tablet (40 mg total) by mouth daily.  30 tablet  6  . glucose blood (ACCU-CHEK AVIVA PLUS) test strip USE AS INSTRUCTED TO CHECK BLOOD SUGAR 3 X DAILY  150 each  2  . insulin aspart (NOVOLOG FLEXPEN) 100 UNIT/ML injection Take 4-6 units before meals once daily  15 mL  1  . insulin glargine (LANTUS SOLOSTAR) 100 UNIT/ML injection Inject 22 Units into the skin 2 (two) times daily.  15 mL  3   No current facility-administered medications on file prior to visit.    Allergies  Allergen Reactions  . Penicillins     Past Medical History  Diagnosis Date  . Diabetes mellitus   . ED (erectile dysfunction)   . Depression   . Hyperlipidemia   . Umbilical hemorrhage     No past surgical history on file.  Family History  Problem Relation Age of Onset  . COPD Mother   . Diabetes Father   . Diabetes Maternal Grandfather   . Diabetes Paternal Grandmother   . Cancer Neg Hx     NO PROSTATE OR COLON CANCER    History   Social History  . Marital Status: Divorced    Spouse Name: N/A    Number of Children: 2  . Years of Education: N/A   Occupational History  . RETIRED--POSTAL SERVICE    Social History Main Topics  . Smoking status: Current Every Day Smoker  .  Smokeless tobacco: Never Used  . Alcohol Use: Yes  . Drug Use: No  . Sexually Active: Not on file   Other Topics Concern  . Not on file   Social History Narrative  . No narrative on file   Review of Systems  Constitutional: Negative for fatigue and unexpected weight change.       Wears seat belt  HENT: Negative for hearing loss, congestion, rhinorrhea, dental problem and tinnitus.        Due to see dentist   Eyes: Negative for visual disturbance.       No diplopia or unilateral vision loss  Respiratory: Negative for cough, chest tightness and shortness of breath.   Cardiovascular: Negative for chest pain, palpitations and leg swelling.  Gastrointestinal: Negative for nausea, vomiting, abdominal pain, constipation and blood in stool.       No heartburn  Endocrine: Negative for cold intolerance, heat intolerance, polydipsia and polyuria.  Genitourinary: Positive for urgency and difficulty urinating. Negative for frequency.       Mild dribbling No sex--no problem (discussed safe sex)  Musculoskeletal: Positive for back pain. Negative for joint swelling and arthralgias.       Left shoulder pain---especially at night Not much help with aleve Some weakness when arm extended  Skin: Negative for rash.  No suspicious lesions  Allergic/Immunologic: Negative for environmental allergies and immunocompromised state.  Neurological: Positive for numbness and headaches. Negative for dizziness, syncope, weakness and light-headedness.       Occ occipital headaches---aleve helps this Numbness in great toes and both 4th and 5th fingers (intermittent)  Hematological: Negative for adenopathy. Does not bruise/bleed easily.  Psychiatric/Behavioral: Negative for sleep disturbance. The patient is not nervous/anxious.        Objective:   Physical Exam  Constitutional: He is oriented to person, place, and time. He appears well-developed and well-nourished. No distress.  HENT:  Head:  Normocephalic and atraumatic.  Right Ear: External ear normal.  Left Ear: External ear normal.  Mouth/Throat: Oropharynx is clear and moist. No oropharyngeal exudate.  Eyes: Conjunctivae and EOM are normal. Pupils are equal, round, and reactive to light.  Neck: Normal range of motion. Neck supple. No thyromegaly present.  Cardiovascular: Normal rate, regular rhythm, normal heart sounds and intact distal pulses.  Exam reveals no gallop.   No murmur heard. Pulmonary/Chest: Effort normal and breath sounds normal. No respiratory distress. He has no wheezes. He has no rales.  Abdominal: Soft. Bowel sounds are normal. There is no tenderness.  Musculoskeletal: Normal range of motion. He exhibits no edema.  Lymphadenopathy:    He has no cervical adenopathy.  Neurological: He is alert and oriented to person, place, and time.  Skin: Skin is warm. No rash noted.  Benign nevi  Psychiatric: He has a normal mood and affect. His behavior is normal.          Assessment & Plan:

## 2013-03-26 NOTE — Assessment & Plan Note (Signed)
Discussed  Not willing to stop

## 2013-03-26 NOTE — Assessment & Plan Note (Signed)
In remission Needs to stay on the meds

## 2013-03-26 NOTE — Patient Instructions (Signed)
Please set up your eye exam as soon as possilbe

## 2013-03-26 NOTE — Assessment & Plan Note (Signed)
Healthy Starting to walk again Will defer PSA to next year Fecal immunoassay

## 2013-03-26 NOTE — Assessment & Plan Note (Signed)
Stopped statin due elevated LFTs Will check labs again

## 2013-03-27 ENCOUNTER — Other Ambulatory Visit: Payer: Self-pay | Admitting: Internal Medicine

## 2013-03-27 DIAGNOSIS — E1065 Type 1 diabetes mellitus with hyperglycemia: Secondary | ICD-10-CM

## 2013-03-27 LAB — HEPATITIS PANEL, ACUTE
Hep B C IgM: NEGATIVE
Hepatitis B Surface Ag: NEGATIVE

## 2013-04-17 ENCOUNTER — Other Ambulatory Visit (INDEPENDENT_AMBULATORY_CARE_PROVIDER_SITE_OTHER): Payer: Federal, State, Local not specified - PPO

## 2013-04-17 DIAGNOSIS — Z1211 Encounter for screening for malignant neoplasm of colon: Secondary | ICD-10-CM

## 2013-04-17 LAB — FECAL OCCULT BLOOD, IMMUNOCHEMICAL: Fecal Occult Bld: POSITIVE

## 2013-04-18 ENCOUNTER — Telehealth: Payer: Self-pay | Admitting: Internal Medicine

## 2013-04-18 DIAGNOSIS — K921 Melena: Secondary | ICD-10-CM

## 2013-04-18 NOTE — Telephone Encounter (Signed)
Discussed positive fecal test Will set up colonoscopy Should be able to do direct

## 2013-04-20 ENCOUNTER — Encounter: Payer: Self-pay | Admitting: Gastroenterology

## 2013-04-20 ENCOUNTER — Other Ambulatory Visit: Payer: Self-pay | Admitting: Internal Medicine

## 2013-04-24 ENCOUNTER — Other Ambulatory Visit: Payer: Self-pay | Admitting: *Deleted

## 2013-04-24 MED ORDER — GLUCOSE BLOOD VI STRP: ORAL_STRIP | Status: DC

## 2013-05-15 ENCOUNTER — Encounter: Payer: Self-pay | Admitting: Gastroenterology

## 2013-05-17 ENCOUNTER — Ambulatory Visit: Payer: Federal, State, Local not specified - PPO | Admitting: Gastroenterology

## 2013-06-05 ENCOUNTER — Other Ambulatory Visit: Payer: Self-pay | Admitting: Internal Medicine

## 2013-06-05 ENCOUNTER — Other Ambulatory Visit: Payer: Self-pay | Admitting: *Deleted

## 2013-06-05 MED ORDER — INSULIN GLARGINE 100 UNIT/ML ~~LOC~~ SOLN: 22.0000 [IU] | Freq: Two times a day (BID) | SUBCUTANEOUS | Status: DC

## 2013-06-13 ENCOUNTER — Encounter: Payer: Self-pay | Admitting: Internal Medicine

## 2013-06-13 ENCOUNTER — Ambulatory Visit (INDEPENDENT_AMBULATORY_CARE_PROVIDER_SITE_OTHER): Payer: Federal, State, Local not specified - PPO | Admitting: Internal Medicine

## 2013-06-13 VITALS — BP 118/64 | HR 90 | Temp 98.9°F | Resp 10 | Ht 73.0 in | Wt 173.8 lb

## 2013-06-13 DIAGNOSIS — E1049 Type 1 diabetes mellitus with other diabetic neurological complication: Secondary | ICD-10-CM

## 2013-06-13 MED ORDER — INSULIN ASPART 100 UNIT/ML ~~LOC~~ SOLN: SUBCUTANEOUS | Status: DC

## 2013-06-13 MED ORDER — INSULIN GLARGINE 100 UNIT/ML ~~LOC~~ SOLN: SUBCUTANEOUS | Status: DC

## 2013-06-13 NOTE — Progress Notes (Signed)
Patient ID: Jackson [name], male   DOB: [birth year], 62 y.o.   MRN: [id number]  HPI: Jackson [name] is a 62 y.o.-year-old male, referred by his PCP, Dr. Alphonsus Sias, for management of DM1, uncontrolled, with complications (DKA episodes in the past - last 08/2009, ED). 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  Patient has been diagnosed with diabetes in 2006.  Last hemoglobin A1c was: Lab Results  Component Value Date   HGBA1C 10.3* 03/26/2013    Pt is on a regimen of: - Lantus 22 units 2x a day - pens - Novolog 4-6 units tid ac - pens  Pt checks his sugars 2-3x a day and they are: - am: 180-220, sometimes lows 57 - 1 mo ago - before dinner: 200s - bedtime: 200s He feels sugars are higher throughout the day Has lows, but <1 a week. Lowest sugar was 57; he has hypoglycemia awareness at 70s- 80s. Highest sugar was 420, evening.  Pt's meals are (not eating regularly - 2-3 meals a day): - Breakfast: egg, sausage, muffin, bagels, cereal - buys the b'fast - Lunch: soup, tuna or lunch meat with tomatoes, sandwich - more likely to skip - Dinner: soup, frozen dinner - Snacks: fruit, yoghurt, vegetables Drinks 'a lot" of soft drinks - diet, 4-5 cans a day   - no CKD, last BUN/creatinine:  Lab Results  Component Value Date   BUN 11 03/26/2013   CREATININE 0.8 03/26/2013   - last set of lipids: Lab Results  Component Value Date   CHOL 240* 03/26/2013   HDL 64.20 03/26/2013   LDLCALC 107* 01/17/2012   LDLDIRECT 147.5 03/26/2013   TRIG 101.0 03/26/2013   CHOLHDL 4 03/26/2013  He was on the Simvastatin but taken off (the test above was after 6 mo off Lipitor). He takes ASA 81. - last eye exam was in 07/2011. No DR.  - no numbness and tingling in his feet.   Pt has FH of DM in father - DM2, brother - DM2.  ROS: Constitutional: no weight gain/loss, + decreased appetite, + fatigue, no subjective hyperthermia/hypothermia, + nocturia Eyes: no blurry vision, no xerophthalmia ENT: no sore throat, no nodules  palpated in throat, no dysphagia/odynophagia, no hoarseness Cardiovascular: no CP/SOB/palpitations/leg swelling Respiratory: no cough/SOB Gastrointestinal: no N/V/D/C Musculoskeletal: no muscle/joint aches Skin: no rashes Neurological: no tremors/numbness/tingling/dizziness Psychiatric: + depression/no anxiety  Past Medical History  Diagnosis Date  . Diabetes mellitus   . ED (erectile dysfunction)   . Depression   . Hyperlipidemia   . Umbilical hemorrhage    History reviewed. No pertinent past surgical history. History   Social History  . Marital Status: Divorced    Spouse Name: N/A    Number of Children: 2   Occupational History  . RETIRED--POSTAL SERVICE    Social History Main Topics  . Smoking status: Current Every Day Smoker  . Smokeless tobacco: Never Used  . Alcohol Use: Yes - beer, 1-2 drinks,  2-3 x a week  . Drug Use: No  . Sexual Activity: Not Currently   Social History Narrative   Regular exercise: not really   Caffeine use: soda and coffee daily   Current Outpatient Prescriptions on File Prior to Visit  Medication Sig Dispense Refill  . citalopram (CELEXA) 40 MG tablet TAKE 1 TABLET BY MOUTH DAILY  30 tablet  11  . glucose blood (ACCU-CHEK AVIVA PLUS) test strip USE AS INSTRUCTED TO CHECK BLOOD SUGAR 3 X DAILY  150 each  2  No current facility-administered medications on file prior to visit.   Allergies  Allergen Reactions  . Penicillins    Family History  Problem Relation Age of Onset  . COPD Mother   . Diabetes Father   . Diabetes Maternal Grandfather   . Diabetes Paternal Grandmother   . Cancer Neg Hx     NO PROSTATE OR COLON CANCER   PE: BP 118/64  Pulse 90  Temp(Src) 98.9 F (37.2 C) (Oral)  Resp 10  Ht 6\' 1"  (1.854 m)  Wt 173 lb 12.8 oz (78.835 kg)  BMI 22.94 kg/m2  SpO2 97% Wt Readings from Last 3 Encounters:  06/13/13 173 lb 12.8 oz (78.835 kg)  03/26/13 177 lb (80.287 kg)  01/17/12 180 lb (81.647 kg)   Constitutional:  thin, in NAD Eyes: PERRLA, EOMI, no exophthalmos ENT: moist mucous membranes, no thyromegaly, no cervical lymphadenopathy Cardiovascular: RRR, No MRG Respiratory: CTA B Gastrointestinal: abdomen soft, NT, ND, BS+ Musculoskeletal: no deformities, strength intact in all 4 Skin: moist, warm, no rashes Neurological: no tremor with outstretched hands, DTR normal in all 4  ASSESSMENT: 1. DM1, uncontrolled, complications - DKA episodes - last 08/2009 - ED  PLAN:  1. Patient with long-standing, uncontrolled DM1, on basal and (a little) bolus insulin with fluctuating sugars, but mostly with hypoglycemia. - We discussed that an ideal insulin regimen will have approximately the same amount of basal as bolus insulin in a day he knows that he is supposed to use NovoLog more often than he uses it now.  -  I suggested the following changes in his insulin regimen:  Patient Instructions  Decrease Lantus to 30 units and take it at bedtime. Change the NovoLog as follows: - 8 units with a smaller meal - 10 units with a larger meal Add the following correction Novolog scale (add to the mealtime doses above if sugars before a meal is >150): - 151-175: + 1 unit  - 176-200: + 2 units  - 201-225: + 3 units  - 226-250: + 4 units  - 251-275: + 5 units - 276-300: + 6 units - discussed proper injection techniques:  Preference for the abdominal sq tissue  Rotation of sites  Change needle for each injection  Keep needle in for 10 sec after last unit of insulin in - Strongly advised him to start checking her sugars at different times of the day - check 3 times a day, rotating checks - given sugar log and advised how to fill it and to bring it at next appt  - given foot care handout and explained the principles  - given instructions for hypoglycemia management "15-15 rule"  - advised for yearly eye exams >> he needs to schedule a new one - given flu vaccine today  - will check a hemoglobin A1c next  visit - Return to clinic in one month with sugar log

## 2013-06-13 NOTE — Patient Instructions (Signed)
Decrease Lantus to 30 units and take it at bedtime. Change the NovoLog as follows: - 8 units with a smaller meal - 10 units with a larger meal Add the following correction Novolog scale (add to the mealtime doses above if sugars before a meal is >150: - 151-175: + 1 unit  - 176-200: + 2 units  - 201-225: + 3 units  - 226-250: + 4 units  - 251-275: + 5 units - 276-300: + 6 units Please return in 1 month with your sugar log.  Basic Rules for Patients with Type I Diabetes Mellitus  1. The American Diabetes Association (ADA) recommended targets: - fasting sugar <130 - after meal sugar <180 - HbA1C <7%  2. Engage in ?150 min moderate exercise per week  3. Make sure you have ?8h of sleep every night as this helps both blood sugars and your weight.  4. Always keep a sugar log (not only record in your meter) and bring it to all appointments with [place].  5. "15-15 rule" for hypoglycemia: if sugars are low, take 15 g of carbs** ("fast sugar" - e.g. 4 glucose tablets, 4 oz orange juice), wait 15 min, then check sugars again. If still <80, repeat. Continue  until your sugars >80, then eat a normal meal.   6. Check sugar before driving. If <100, correct, and only start driving if sugars rise ?454. Check sugar every hour when on a long drive.  7. Check sugar before exercising. If <100, correct, and only start exercising if sugars rise ?100. Check sugar every hour when on a long exercise routine and 1h after you finished exercising.   If >250, check urine for ketones. If you have moderate-large ketones in urine, do not start exercise. Hydrate yourself with clear liquids and correct the high sugar. Recheck sugars and ketones before attempting to exercise.  Be aware that you might need less insulin when exercising.  *intense, short, exercise bursts can increase your sugars, but  *less intense, longer (>1h), exercise routines can decrease your sugars.  If you are on a pump, you might need to  decrease your basal rate by 10% or more (or even disconnect your pump) while you exercise to prevent low sugars. Do not disconnect your pump by more than 3 hours at a time! You also might need to decrease your insulin bolus for the meal prior to your exercise time by 20% or more.  8. Make sure you have a MedAlert bracelet or pendant mentioning "Type I Diabetes Mellitus". If you have a prior episode of severe hypoglycemia or hypoglycemia unawareness, it should also mention this.  9. Please do not walk barefoot. Inspect your feet for sores/cuts and let us know if you have them.  10. Please call  Endocrinology with any questions and concerns [phone]).   **E.g. of "fast carbs":   first choice (15 g):  1 tube glucose gel, GlucoPouch 15, 2 oz glucose liquid   second choice (15-16 g):  3 or 4 glucose tablets (best taken  with water), 15 Dextrose Bits chewable   third choice (15-20 g):   cup fruit juice,  cup regular soda, 1 cup skim milk,  1 cup sports drink   fourth choice (15-20 g):  1 small tube [place] gel (not frosting), 2 tbsp raisins, 1 tbsp table sugar,  candy, jelly beans, gum drops - check package for carb amount   (adapted from: Juluis Rainier. "Insulin therapy and hypoglycemia" Endocrinol Metab Clin N Am 2012, 41: 57-87)

## 2013-06-15 ENCOUNTER — Ambulatory Visit (INDEPENDENT_AMBULATORY_CARE_PROVIDER_SITE_OTHER): Payer: Federal, State, Local not specified - PPO | Admitting: Gastroenterology

## 2013-06-15 ENCOUNTER — Encounter: Payer: Self-pay | Admitting: Gastroenterology

## 2013-06-15 VITALS — BP 102/62 | HR 88 | Ht 74.0 in | Wt 177.2 lb

## 2013-06-15 DIAGNOSIS — R109 Unspecified abdominal pain: Secondary | ICD-10-CM

## 2013-06-15 DIAGNOSIS — R195 Other fecal abnormalities: Secondary | ICD-10-CM

## 2013-06-15 NOTE — Assessment & Plan Note (Signed)
Hemoccult-positive stool-rule out colonic bleeding source is including polyps, AVMs, hemorrhoids or neoplasm.  Patient is taking NSAIDs regularly which could be causing erosions or ulcerations anywhere in the GI tract.  Recommendations #1 colonoscopy #2 if colonoscopy is negative I will obtain followup Hemoccults

## 2013-06-15 NOTE — Patient Instructions (Signed)

## 2013-06-15 NOTE — Progress Notes (Signed)
History of Present Illness: 62 year old white male with diabetes referred for evaluation of Hemoccult-positive stool.  This was noted on routine testing.  He has no GI complaints including change in bowel habits, abdominal or rectal pain.  He takes Aleve every other day for arm pain.    Past Medical History  Diagnosis Date  . Diabetes mellitus   . ED (erectile dysfunction)   . Depression   . Hyperlipidemia   . Umbilical hemorrhage    Past Surgical History  Procedure Laterality Date  . Fetal surgery for congenital hernia     family history includes COPD in his mother; Diabetes in his father, maternal grandfather, and paternal grandmother. There is no history of Cancer. Current Outpatient Prescriptions  Medication Sig Dispense Refill  . citalopram (CELEXA) 40 MG tablet TAKE 1 TABLET BY MOUTH DAILY  30 tablet  11  . glucose blood (ACCU-CHEK AVIVA PLUS) test strip USE AS INSTRUCTED TO CHECK BLOOD SUGAR 3 X DAILY  150 each  2  . insulin aspart (NOVOLOG FLEXPEN) 100 UNIT/ML injection Take 8-10 units before the main 3 meals daily  15 mL  1  . insulin glargine (LANTUS) 100 UNIT/ML injection Pen: Inject under skin 30 units at bedtime  15 mL  0   No current facility-administered medications for this visit.   Allergies as of 06/15/2013 - Review Complete 06/15/2013  Allergen Reaction Noted  . Penicillins      reports that he has been smoking.  He has never used smokeless tobacco. He reports that he drinks alcohol. He reports that he does not use illicit drugs.     Review of Systems: Pertinent positive and negative review of systems were noted in the above HPI section. All other review of systems were otherwise negative.  Vital signs were reviewed in today's medical record Physical Exam: General: Well developed , well nourished, no acute distress Skin: anicteric Head: Normocephalic and atraumatic Eyes:  sclerae anicteric, EOMI Ears: Normal auditory acuity Mouth: No deformity or  lesions Neck: Supple, no masses or thyromegaly Lungs: Clear throughout to auscultation Heart: Regular rate and rhythm; no murmurs, rubs or bruits Abdomen: Soft, non tender and non distended. No masses, hepatosplenomegaly or hernias noted. Normal Bowel sounds Rectal:deferred Musculoskeletal: Symmetrical with no gross deformities  Skin: No lesions on visible extremities Pulses:  Normal pulses noted Extremities: No clubbing, cyanosis, edema or deformities noted Neurological: Alert oriented x 4, grossly nonfocal Cervical Nodes:  No significant cervical adenopathy Inguinal Nodes: No significant inguinal adenopathy Psychological:  Alert and cooperative. Normal mood and affect

## 2013-06-28 ENCOUNTER — Encounter: Payer: Federal, State, Local not specified - PPO | Admitting: Gastroenterology

## 2013-07-05 ENCOUNTER — Other Ambulatory Visit: Payer: Self-pay

## 2013-08-22 ENCOUNTER — Telehealth: Payer: Self-pay | Admitting: *Deleted

## 2013-08-22 MED ORDER — INSULIN GLARGINE 100 UNIT/ML ~~LOC~~ SOLN: SUBCUTANEOUS | Status: DC

## 2013-08-22 NOTE — Telephone Encounter (Signed)
Rx for refill of Lantus.

## 2013-08-24 ENCOUNTER — Other Ambulatory Visit: Payer: Self-pay

## 2013-08-24 ENCOUNTER — Other Ambulatory Visit: Payer: Self-pay | Admitting: *Deleted

## 2013-08-24 MED ORDER — INSULIN GLARGINE 100 UNIT/ML ~~LOC~~ SOLN: SUBCUTANEOUS | Status: DC

## 2013-08-24 NOTE — Telephone Encounter (Signed)
Reviewed chart. Changed e-script details. Medication sent to pt pharmacy.

## 2013-08-24 NOTE — Telephone Encounter (Signed)
Pt left v/m requesting refill lantus to [facility] pharmacy.Please advise. Spoke with Bethann Punches and will send phone note to [facility].Carollee Herter is not available. Pt request cb.

## 2013-08-27 LAB — HM DIABETES EYE EXAM

## 2013-11-14 ENCOUNTER — Other Ambulatory Visit: Payer: Self-pay | Admitting: *Deleted

## 2013-11-14 ENCOUNTER — Other Ambulatory Visit: Payer: Self-pay | Admitting: Endocrinology

## 2013-11-14 MED ORDER — INSULIN GLARGINE 100 UNIT/ML SOLOSTAR PEN: 30.0000 [IU] | PEN_INJECTOR | Freq: Every day | SUBCUTANEOUS | Status: DC

## 2014-02-20 ENCOUNTER — Other Ambulatory Visit: Payer: Self-pay | Admitting: *Deleted

## 2014-02-20 MED ORDER — INSULIN GLARGINE 100 UNIT/ML SOLOSTAR PEN: PEN_INJECTOR | SUBCUTANEOUS | Status: DC

## 2014-02-20 MED ORDER — INSULIN ASPART 100 UNIT/ML ~~LOC~~ SOLN: SUBCUTANEOUS | Status: DC

## 2014-03-27 ENCOUNTER — Encounter: Payer: Self-pay | Admitting: Internal Medicine

## 2014-03-27 ENCOUNTER — Other Ambulatory Visit: Payer: Self-pay | Admitting: Internal Medicine

## 2014-03-27 ENCOUNTER — Ambulatory Visit (INDEPENDENT_AMBULATORY_CARE_PROVIDER_SITE_OTHER): Payer: Federal, State, Local not specified - PPO | Admitting: Internal Medicine

## 2014-03-27 ENCOUNTER — Other Ambulatory Visit: Payer: Self-pay

## 2014-03-27 VITALS — BP 120/70 | HR 83 | Temp 97.8°F | Ht 74.0 in | Wt 168.0 lb

## 2014-03-27 DIAGNOSIS — E1065 Type 1 diabetes mellitus with hyperglycemia: Secondary | ICD-10-CM

## 2014-03-27 DIAGNOSIS — E1049 Type 1 diabetes mellitus with other diabetic neurological complication: Secondary | ICD-10-CM

## 2014-03-27 DIAGNOSIS — E785 Hyperlipidemia, unspecified: Secondary | ICD-10-CM

## 2014-03-27 DIAGNOSIS — Z1211 Encounter for screening for malignant neoplasm of colon: Secondary | ICD-10-CM

## 2014-03-27 DIAGNOSIS — Z125 Encounter for screening for malignant neoplasm of prostate: Secondary | ICD-10-CM

## 2014-03-27 DIAGNOSIS — Z0000 Encounter for general adult medical examination without abnormal findings: Secondary | ICD-10-CM

## 2014-03-27 DIAGNOSIS — F339 Major depressive disorder, recurrent, unspecified: Secondary | ICD-10-CM

## 2014-03-27 LAB — COMPREHENSIVE METABOLIC PANEL
ALT: 35 U/L (ref 0–53)
AST: 55 U/L — AB (ref 0–37)
Albumin: 3.7 g/dL (ref 3.5–5.2)
Alkaline Phosphatase: 160 U/L — ABNORMAL HIGH (ref 39–117)
BUN: 17 mg/dL (ref 6–23)
CALCIUM: 11.8 mg/dL — AB (ref 8.4–10.5)
CHLORIDE: 103 meq/L (ref 96–112)
CO2: 29 meq/L (ref 19–32)
Creatinine, Ser: 0.9 mg/dL (ref 0.4–1.5)
GFR: 85.86 mL/min (ref >60.00)
Glucose, Bld: 242 mg/dL — ABNORMAL HIGH (ref 70–99)
POTASSIUM: 4.3 meq/L (ref 3.5–5.1)
Sodium: 135 mEq/L (ref 135–145)
Total Bilirubin: 0.7 mg/dL (ref 0.2–1.2)
Total Protein: 6 g/dL (ref 6.0–8.3)

## 2014-03-27 LAB — LIPID PANEL
Cholesterol: 185 mg/dL (ref 0–200)
HDL: 64.2 mg/dL (ref >39.00)
LDL CALC: 106 mg/dL — AB (ref 0–99)
NonHDL: 120.8
Total CHOL/HDL Ratio: 3
Triglycerides: 73 mg/dL (ref 0.0–149.0)
VLDL: 14.6 mg/dL (ref 0.0–40.0)

## 2014-03-27 LAB — CBC WITH DIFFERENTIAL/PLATELET
Basophils Absolute: 0 10*3/uL (ref 0.0–0.1)
Basophils Relative: 0 % (ref 0.0–3.0)
EOS ABS: 0.1 10*3/uL (ref 0.0–0.7)
Eosinophils Relative: 0.9 % (ref 0.0–5.0)
HCT: 38.1 % — ABNORMAL LOW (ref 39.0–52.0)
Hemoglobin: 12.8 g/dL — ABNORMAL LOW (ref 13.0–17.0)
Lymphocytes Relative: 16.7 % (ref 12.0–46.0)
Lymphs Abs: 1.5 10*3/uL (ref 0.7–4.0)
MCHC: 33.5 g/dL (ref 30.0–36.0)
MCV: 98.9 fl (ref 78.0–100.0)
Monocytes Absolute: 0.6 10*3/uL (ref 0.1–1.0)
Monocytes Relative: 6.5 % (ref 3.0–12.0)
NEUTROS ABS: 6.8 10*3/uL (ref 1.4–7.7)
NEUTROS PCT: 75.9 % (ref 43.0–77.0)
PLATELETS: 249 10*3/uL (ref 150.0–400.0)
RBC: 3.85 Mil/uL — ABNORMAL LOW (ref 4.22–5.81)
RDW: 13.7 % (ref 11.5–15.5)
WBC: 8.9 10*3/uL (ref 4.0–10.5)

## 2014-03-27 LAB — MICROALBUMIN / CREATININE URINE RATIO
Creatinine,U: 88.3 mg/dL
MICROALB UR: 0.4 mg/dL (ref 0.0–1.9)
Microalb Creat Ratio: 0.5 mg/g (ref 0.0–30.0)

## 2014-03-27 LAB — PSA: PSA: 0.24 ng/mL (ref 0.10–4.00)

## 2014-03-27 LAB — HM DIABETES FOOT EXAM

## 2014-03-27 LAB — HEMOGLOBIN A1C: HEMOGLOBIN A1C: 10.3 % — AB (ref 4.6–6.5)

## 2014-03-27 LAB — T4, FREE: Free T4: 1.04 ng/dL (ref 0.60–1.60)

## 2014-03-27 MED ORDER — ATORVASTATIN CALCIUM 20 MG PO TABS: 20.0000 mg | ORAL_TABLET | Freq: Every day | ORAL | Status: DC

## 2014-03-27 NOTE — Assessment & Plan Note (Signed)
Problems with simvastatin Will try atorvastatin

## 2014-03-27 NOTE — Patient Instructions (Signed)
Set up blood work in 6 weeks (lipid, hepatic-- 272.4)

## 2014-03-27 NOTE — Progress Notes (Signed)
Subjective:    Patient ID: Jackson [name], male    DOB: 06-20-1951, 63 y.o.   MRN: [id number]  HPI Here for physical  Hasn't been back to Dr Elvera Lennox Checks sugars tid to adjust insulin Feels it is running high Needs follow up  Never had the colonoscopy Didn't have anyone who could bring him  Mostly housebound Spends most of his time at home Chronic depression "about the same" No suicidal thoughts  Had been on simvastatin Raised liver tests and then stopped Willing to try something else  Still smokes Not ready to consider stopping  Current Outpatient Prescriptions on File Prior to Visit  Medication Sig Dispense Refill  . citalopram (CELEXA) 40 MG tablet TAKE 1 TABLET BY MOUTH DAILY  30 tablet  11  . glucose blood (ACCU-CHEK AVIVA PLUS) test strip USE AS INSTRUCTED TO CHECK BLOOD SUGAR 3 X DAILY  150 each  2  . insulin aspart (NOVOLOG FLEXPEN) 100 UNIT/ML injection Take 8-10 units before the main 3 meals daily  15 mL  0  . Insulin Glargine (LANTUS SOLOSTAR) 100 UNIT/ML Solostar Pen Inject 22 units into the skin 2 times daily.  15 mL  0   No current facility-administered medications on file prior to visit.    Allergies  Allergen Reactions  . Penicillins     Past Medical History  Diagnosis Date  . Diabetes mellitus   . ED (erectile dysfunction)   . Depression   . Hyperlipidemia   . Umbilical hemorrhage     Past Surgical History  Procedure Laterality Date  . Fetal surgery for congenital hernia      Family History  Problem Relation Age of Onset  . COPD Mother   . Diabetes Father   . Diabetes Maternal Grandfather   . Diabetes Paternal Grandmother   . Cancer Neg Hx     NO PROSTATE OR COLON CANCER    History   Social History  . Marital Status: Divorced    Spouse Name: N/A    Number of Children: 2  . Years of Education: N/A   Occupational History  . RETIRED--POSTAL SERVICE    Social History Main Topics  . Smoking status: Current Every Day Smoker    . Smokeless tobacco: Never Used  . Alcohol Use: Yes  . Drug Use: No  . Sexual Activity: Not Currently   Other Topics Concern  . Not on file   Social History Narrative   Regular exercise: not really   Caffeine use: soda and coffee daily   Review of Systems  Constitutional: Negative for fatigue and unexpected weight change.       Wears seat belt  HENT: Positive for dental problem. Negative for hearing loss and tinnitus.        Needs some dental work  Eyes: Negative for visual disturbance.       Eye exam in December--no retinopathy  Respiratory: Negative for cough, chest tightness and shortness of breath.   Cardiovascular: Negative for chest pain, palpitations and leg swelling.  Gastrointestinal: Negative for nausea, vomiting, abdominal pain, constipation and blood in stool.       No heartburn  Endocrine: Negative for cold intolerance, heat intolerance, polydipsia and polyuria.  Genitourinary: Positive for urgency. Negative for difficulty urinating.       No sex for a while  Musculoskeletal: Negative for arthralgias, back pain and joint swelling.  Skin: Negative for rash.       Some scaly areas  Allergic/Immunologic: Negative for environmental  allergies and immunocompromised state.  Neurological: Positive for weakness and numbness. Negative for dizziness, syncope, light-headedness and headaches.       Left 4th and 5th fingers numb at times   Psychiatric/Behavioral: Positive for sleep disturbance and dysphoric mood. The patient is not nervous/anxious.        Spends a lot of time awake--then falls asleep in AM Stress with daughter's boyfriend (up in [place])--has grandson now       Objective:   Physical Exam  Constitutional: He is oriented to person, place, and time. He appears well-developed and well-nourished. No distress.  HENT:  Head: Normocephalic and atraumatic.  Right Ear: External ear normal.  Left Ear: External ear normal.  Mouth/Throat: Oropharynx is clear and  moist. No oropharyngeal exudate.  Eyes: Conjunctivae and EOM are normal. Pupils are equal, round, and reactive to light.  Neck: Normal range of motion. Neck supple. No thyromegaly present.  Cardiovascular: Normal rate, regular rhythm, normal heart sounds and intact distal pulses.  Exam reveals no gallop.   No murmur heard. Pulmonary/Chest: Effort normal and breath sounds normal. No respiratory distress. He has no wheezes. He has no rales.  Abdominal: Soft. There is no tenderness.  Musculoskeletal: He exhibits no edema and no tenderness.  Lymphadenopathy:    He has no cervical adenopathy.  Neurological: He is alert and oriented to person, place, and time.  Normal fine touch sensation in plantar feet  Skin: No rash noted. No erythema.  No foot lesions  Psychiatric: He has a normal mood and affect. His behavior is normal.          Assessment & Plan:

## 2014-03-27 NOTE — Assessment & Plan Note (Signed)
Will check labs Still running high Needs to see Dr Elvera LennoxGherghe

## 2014-03-27 NOTE — Assessment & Plan Note (Signed)
Ongoing mood issues Still on med

## 2014-03-27 NOTE — Assessment & Plan Note (Signed)
Discussed PSA testing--will do this Hasn't gotten colonoscopy--noone to take him. Sent message to Dr Arlyce DiceKaplan to see if there are any options (prolonged observation, patient advocate to be with him???)

## 2014-03-29 ENCOUNTER — Telehealth: Payer: Self-pay

## 2014-03-29 NOTE — Telephone Encounter (Signed)
Message copied by Chrystie NoseHUNT, LINDA R on Fri Mar 29, 2014  1:04 PM ------      Message from: Melvia HeapsKAPLAN, ROBERT D      Created: Wed Mar 27, 2014  1:54 PM       I will check a CBC, repeat Hemoccults, and I will order a barium enema.  This way we can at least rule out a colonic neoplasm.             Bonita QuinLinda, please order a barium enema       ----- Message -----         From: Karie Schwalbeichard I Letvak, MD         Sent: 03/27/2014  11:00 AM           To: Louis Meckelobert D Kaplan, MD            Rob,            He was unable to get the colonoscopy because he has noone to bring him or stay with him.      I am concerned we are missing something.      Is there any alternatives so we can get the test done?            Rich       ------

## 2014-03-29 NOTE — Telephone Encounter (Signed)
[facility]@10 :30am. Pt to arrive there at 10:15am.   Barium Enema Prep  Please purchase the following items from the laxative section of your drug store 10 oz.  Magnesium Citrate Bottle 4 Bisacodyl Tablets-Enteric coated 1 Bisacodyl Suppository   Day before exam On the day before exam you will need to be on a clear liquid diet.   NO solid foods or dairy products are allowed.  You may have unlimited quantities of the following   Broth Clear jello/gelatin with no whole foods added (no red) soft drinks, gatorade, water black coffee or tea (no milk or cream) No red jello.   popsicles  1.  Starting at 12:00 noon drink an 8 oz glass water every hour.  2.  Around 5:30 pm- Drink entire bottle of Magnesium Citrate      (This should produce a bowel movement in 30 minutes to 6 hours.) 3.  At 7:30 pm take the four (4)  Bisacodyl Tablets with on (1) full 8 oz glass of water.        (Usually produces a bowel movement in 6-12 hours.) 4.  Continue drinking 1 8 oz glass of water every hour till abut 9:00 pm.    Day of exam 1.  Do Not Eat or Drink Anything 2.  If you have a colostomy do not take the suppository. 3.  2 hours before the exam, unwrap the foil wrapper from bisacodyl suppository and             discard the foil.  While lying on your side with thigh elevated, insert the suppository           into the rectum and gently push in as far as possible.  Retain the suppository for at           least 15 minutes, if possible, before evacuating, even if the urge is strong.      Bowel evacuation usually occurs within 15 to 60 minutes.  Patients requiring                     assistance should have a bed pan, commode or help readily available.   Report to [facility] at [facility] on 04/03/14 at10:15 am.

## 2014-04-01 NOTE — Telephone Encounter (Signed)
Left message for pt to call back  °

## 2014-04-02 NOTE — Telephone Encounter (Signed)
Pt called back and does not want to have a barium enema. Explained to pt that it was up to him, he does not want to have this done. Note forwarded to Dr. Alphonsus SiasLetvak and Dr. Arlyce DiceKaplan.

## 2014-04-03 ENCOUNTER — Ambulatory Visit (HOSPITAL_COMMUNITY): Admission: RE | Admit: 2014-04-03 | Payer: Federal, State, Local not specified - PPO | Source: Ambulatory Visit

## 2014-04-03 NOTE — Telephone Encounter (Signed)
Noted Not sure what else we can do

## 2014-06-18 ENCOUNTER — Ambulatory Visit (INDEPENDENT_AMBULATORY_CARE_PROVIDER_SITE_OTHER): Payer: Federal, State, Local not specified - PPO | Admitting: Endocrinology

## 2014-06-18 ENCOUNTER — Encounter: Payer: Self-pay | Admitting: Endocrinology

## 2014-06-18 VITALS — BP 118/76 | HR 70 | Wt 163.5 lb

## 2014-06-18 DIAGNOSIS — E1065 Type 1 diabetes mellitus with hyperglycemia: Principal | ICD-10-CM

## 2014-06-18 DIAGNOSIS — E785 Hyperlipidemia, unspecified: Secondary | ICD-10-CM

## 2014-06-18 DIAGNOSIS — F17200 Nicotine dependence, unspecified, uncomplicated: Secondary | ICD-10-CM

## 2014-06-18 DIAGNOSIS — IMO0002 Reserved for concepts with insufficient information to code with codable children: Secondary | ICD-10-CM

## 2014-06-18 DIAGNOSIS — E1049 Type 1 diabetes mellitus with other diabetic neurological complication: Secondary | ICD-10-CM

## 2014-06-18 DIAGNOSIS — E1041 Type 1 diabetes mellitus with diabetic mononeuropathy: Secondary | ICD-10-CM

## 2014-06-18 LAB — HM DIABETES FOOT EXAM: HM DIABETIC FOOT EXAM: NORMAL

## 2014-06-18 MED ORDER — GLUCOSE BLOOD VI STRP: ORAL_STRIP | Status: DC

## 2014-06-18 MED ORDER — INSULIN GLARGINE 100 UNIT/ML SOLOSTAR PEN: PEN_INJECTOR | SUBCUTANEOUS | Status: DC

## 2014-06-18 NOTE — Assessment & Plan Note (Signed)
Discussed smoking cessation and long term risks, briefly.

## 2014-06-18 NOTE — Progress Notes (Signed)
Pre visit review using our clinic review tool, if applicable. No additional management support is needed unless otherwise documented below in the visit note. 

## 2014-06-18 NOTE — Assessment & Plan Note (Signed)
Recent LDL close to target on current therapy. Continue Atorvastatin- tolerating well.
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
## 2014-06-18 NOTE — Assessment & Plan Note (Addendum)
Last A1c not at target. Will update at next visit in 2 weeks post 90 day mark.  Discussed importance of controlling sugars better.  He was asked to check sugars 4x daily.  Given his recall of his last few readings, will increase lantus to 34 units daily.  Continue Novolog scale as detailed below 8+1 approximately.  He is not eating consistently and skipping meals. He will try to eat atleast 2 meals daily at BF and lunch. Discussed dietary modifications, portion sizes.  Discussed need to visit dentist, and if he decreases his smoking, might be able to get his partial dentures with that money saved.  Hypoglycemia recognition and treatment protocol discussed.    RTC 2weeks , based on sugars then will adjust his insulins. Need more data to adjust insulins.

## 2014-06-18 NOTE — Patient Instructions (Addendum)
Eats consistently twice daily.  Check sugars four times ( premeals and at bedtime).   Change lantus to 34 units at bedtime.   Change the NovoLog as follows:  - 8 units with a smaller meal  - 10 units with a larger meal  Add the following correction Novolog scale (add to the mealtime doses above if sugars before a meal is >150):  - 151-175: + 1 unit  - 176-200: + 2 units  - 201-225: + 3 units  - 226-250: + 4 units  - 251-275: + 5 units  - 276-300: + 6 units  Please come back for a follow-up appointment in 2 weeks

## 2014-06-19 ENCOUNTER — Inpatient Hospital Stay (HOSPITAL_COMMUNITY)
Admission: EM | Admit: 2014-06-19 | Discharge: 2014-06-24 | DRG: 698 | Disposition: A | Discharge status: Home / Self Care | Payer: Federal, State, Local not specified - PPO | Attending: General Surgery | Admitting: General Surgery

## 2014-06-19 ENCOUNTER — Encounter (HOSPITAL_COMMUNITY): Payer: Self-pay | Admitting: Anesthesiology

## 2014-06-19 ENCOUNTER — Inpatient Hospital Stay: Admit: 2014-06-19 | Payer: Self-pay | Admitting: Surgery

## 2014-06-19 ENCOUNTER — Encounter (HOSPITAL_COMMUNITY): Payer: Self-pay | Admitting: Emergency Medicine

## 2014-06-19 ENCOUNTER — Emergency Department: Payer: Self-pay | Admitting: Emergency Medicine

## 2014-06-19 DIAGNOSIS — E10649 Type 1 diabetes mellitus with hypoglycemia without coma: Secondary | ICD-10-CM | POA: Diagnosis present

## 2014-06-19 DIAGNOSIS — E1010 Type 1 diabetes mellitus with ketoacidosis without coma: Secondary | ICD-10-CM | POA: Diagnosis present

## 2014-06-19 DIAGNOSIS — N179 Acute kidney failure, unspecified: Secondary | ICD-10-CM | POA: Diagnosis present

## 2014-06-19 DIAGNOSIS — F329 Major depressive disorder, single episode, unspecified: Secondary | ICD-10-CM | POA: Diagnosis present

## 2014-06-19 DIAGNOSIS — Z794 Long term (current) use of insulin: Secondary | ICD-10-CM

## 2014-06-19 DIAGNOSIS — F17210 Nicotine dependence, cigarettes, uncomplicated: Secondary | ICD-10-CM | POA: Diagnosis present

## 2014-06-19 DIAGNOSIS — E1049 Type 1 diabetes mellitus with other diabetic neurological complication: Secondary | ICD-10-CM | POA: Diagnosis present

## 2014-06-19 DIAGNOSIS — S37092A Other injury of left kidney, initial encounter: Secondary | ICD-10-CM | POA: Diagnosis present

## 2014-06-19 DIAGNOSIS — R509 Fever, unspecified: Secondary | ICD-10-CM

## 2014-06-19 DIAGNOSIS — S270XXA Traumatic pneumothorax, initial encounter: Secondary | ICD-10-CM

## 2014-06-19 DIAGNOSIS — E1065 Type 1 diabetes mellitus with hyperglycemia: Secondary | ICD-10-CM

## 2014-06-19 DIAGNOSIS — Z825 Family history of asthma and other chronic lower respiratory diseases: Secondary | ICD-10-CM | POA: Diagnosis not present

## 2014-06-19 DIAGNOSIS — E875 Hyperkalemia: Secondary | ICD-10-CM | POA: Diagnosis present

## 2014-06-19 DIAGNOSIS — N189 Chronic kidney disease, unspecified: Secondary | ICD-10-CM | POA: Diagnosis present

## 2014-06-19 DIAGNOSIS — E785 Hyperlipidemia, unspecified: Secondary | ICD-10-CM | POA: Diagnosis present

## 2014-06-19 DIAGNOSIS — J939 Pneumothorax, unspecified: Secondary | ICD-10-CM

## 2014-06-19 DIAGNOSIS — Z833 Family history of diabetes mellitus: Secondary | ICD-10-CM | POA: Diagnosis not present

## 2014-06-19 DIAGNOSIS — Z823 Family history of stroke: Secondary | ICD-10-CM

## 2014-06-19 DIAGNOSIS — S37009A Unspecified injury of unspecified kidney, initial encounter: Secondary | ICD-10-CM

## 2014-06-19 DIAGNOSIS — I10 Essential (primary) hypertension: Secondary | ICD-10-CM | POA: Diagnosis present

## 2014-06-19 DIAGNOSIS — D62 Acute posthemorrhagic anemia: Secondary | ICD-10-CM | POA: Diagnosis present

## 2014-06-19 DIAGNOSIS — S37062A Major laceration of left kidney, initial encounter: Principal | ICD-10-CM | POA: Diagnosis present

## 2014-06-19 DIAGNOSIS — S2242XA Multiple fractures of ribs, left side, initial encounter for closed fracture: Secondary | ICD-10-CM | POA: Diagnosis present

## 2014-06-19 DIAGNOSIS — S2232XA Fracture of one rib, left side, initial encounter for closed fracture: Secondary | ICD-10-CM

## 2014-06-19 DIAGNOSIS — S35413A Laceration of unspecified renal artery, initial encounter: Secondary | ICD-10-CM

## 2014-06-19 DIAGNOSIS — IMO0002 Reserved for concepts with insufficient information to code with codable children: Secondary | ICD-10-CM

## 2014-06-19 HISTORY — DX: Umbilical hernia without obstruction or gangrene: K42.9

## 2014-06-19 LAB — COMPREHENSIVE METABOLIC PANEL
ALK PHOS: 174 U/L — AB
ALT: 149 U/L — AB
ALT: 113 U/L — ABNORMAL HIGH (ref 0–53)
ANION GAP: 9 (ref 7–16)
AST: 53 U/L — AB (ref 0–37)
Albumin: 3.4 g/dL (ref 3.4–5.0)
Albumin: 3.5 g/dL (ref 3.5–5.2)
Alkaline Phosphatase: 166 U/L — ABNORMAL HIGH (ref 39–117)
Anion gap: 20 — ABNORMAL HIGH (ref 5–15)
BUN: 15 mg/dL (ref 7–18)
BUN: 16 mg/dL (ref 6–23)
Bilirubin,Total: 0.5 mg/dL (ref 0.2–1.0)
CALCIUM: 11.1 mg/dL — AB (ref 8.4–10.5)
CHLORIDE: 103 mmol/L (ref 98–107)
CO2: 18 mEq/L — ABNORMAL LOW (ref 19–32)
Calcium, Total: 10.9 mg/dL — ABNORMAL HIGH (ref 8.5–10.1)
Chloride: 95 mEq/L — ABNORMAL LOW (ref 96–112)
Co2: 22 mmol/L (ref 21–32)
Creatinine, Ser: 1.31 mg/dL (ref 0.50–1.35)
Creatinine: 1.31 mg/dL — ABNORMAL HIGH (ref 0.60–1.30)
EGFR (African American): >60
GFR CALC NON AF AMER: 59 — AB
GFR calc non Af Amer: 56 mL/min — ABNORMAL LOW (ref >90)
GFR, EST AFRICAN AMERICAN: 65 mL/min — AB (ref >90)
GLUCOSE: 368 mg/dL — AB (ref 65–99)
GLUCOSE: 472 mg/dL — AB (ref 70–99)
Osmolality: 284 (ref 275–301)
Potassium: 4.4 mmol/L (ref 3.5–5.1)
Potassium: 5 mEq/L (ref 3.7–5.3)
SGOT(AST): 66 U/L — ABNORMAL HIGH (ref 15–37)
SODIUM: 134 mmol/L — AB (ref 136–145)
SODIUM: 133 meq/L — AB (ref 137–147)
TOTAL PROTEIN: 5.9 g/dL — AB (ref 6.4–8.2)
TOTAL PROTEIN: 5.7 g/dL — AB (ref 6.0–8.3)
Total Bilirubin: 0.7 mg/dL (ref 0.3–1.2)

## 2014-06-19 LAB — CBC WITH DIFFERENTIAL/PLATELET
BASOS ABS: 0 10*3/uL (ref 0.0–0.1)
Basophils Relative: 0 % (ref 0–1)
EOS ABS: 0 10*3/uL (ref 0.0–0.7)
EOS PCT: 0 % (ref 0–5)
HCT: 31.1 % — ABNORMAL LOW (ref 39.0–52.0)
Hemoglobin: 10.6 g/dL — ABNORMAL LOW (ref 13.0–17.0)
Lymphocytes Relative: 9 % — ABNORMAL LOW (ref 12–46)
Lymphs Abs: 1 10*3/uL (ref 0.7–4.0)
MCH: 32.6 pg (ref 26.0–34.0)
MCHC: 34.1 g/dL (ref 30.0–36.0)
MCV: 95.7 fL (ref 78.0–100.0)
Monocytes Absolute: 0.6 10*3/uL (ref 0.1–1.0)
Monocytes Relative: 5 % (ref 3–12)
Neutro Abs: 9.8 10*3/uL — ABNORMAL HIGH (ref 1.7–7.7)
Neutrophils Relative %: 86 % — ABNORMAL HIGH (ref 43–77)
PLATELETS: 232 10*3/uL (ref 150–400)
RBC: 3.25 MIL/uL — ABNORMAL LOW (ref 4.22–5.81)
RDW: 12.9 % (ref 11.5–15.5)
WBC: 11.4 10*3/uL — ABNORMAL HIGH (ref 4.0–10.5)

## 2014-06-19 LAB — URINALYSIS, COMPLETE
Bacteria: NONE SEEN
Bilirubin,UR: NEGATIVE
Glucose,UR: >=500 mg/dL (ref 0–75)
Leukocyte Esterase: NEGATIVE
NITRITE: NEGATIVE
PH: 6 (ref 4.5–8.0)
RBC,UR: 196 /HPF (ref 0–5)
Specific Gravity: 1.028 (ref 1.003–1.030)
Squamous Epithelial: <1
WBC UR: 5 /HPF (ref 0–5)

## 2014-06-19 LAB — CBC
HCT: 37.4 % — ABNORMAL LOW (ref 40.0–52.0)
HGB: 12.1 g/dL — AB (ref 13.0–18.0)
MCH: 32.5 pg (ref 26.0–34.0)
MCHC: 32.4 g/dL (ref 32.0–36.0)
MCV: 100 fL (ref 80–100)
PLATELETS: 213 10*3/uL (ref 150–440)
RBC: 3.74 10*6/uL — ABNORMAL LOW (ref 4.40–5.90)
RDW: 13.4 % (ref 11.5–14.5)
WBC: 11.3 10*3/uL — AB (ref 3.8–10.6)

## 2014-06-19 LAB — I-STAT CHEM 8, ED
BUN: 15 mg/dL (ref 6–23)
Calcium, Ion: 1.46 mmol/L — ABNORMAL HIGH (ref 1.13–1.30)
Chloride: 101 mEq/L (ref 96–112)
Creatinine, Ser: 1.3 mg/dL (ref 0.50–1.35)
Glucose, Bld: 459 mg/dL — ABNORMAL HIGH (ref 70–99)
HCT: 34 % — ABNORMAL LOW (ref 39.0–52.0)
HEMOGLOBIN: 11.6 g/dL — AB (ref 13.0–17.0)
Potassium: 4.7 mEq/L (ref 3.7–5.3)
SODIUM: 132 meq/L — AB (ref 137–147)
TCO2: 18 mmol/L (ref 0–100)

## 2014-06-19 LAB — APTT: APTT: 22 s — AB (ref 24–37)

## 2014-06-19 LAB — PROTIME-INR
INR: 1.05 (ref 0.00–1.49)
PROTHROMBIN TIME: 13.8 s (ref 11.6–15.2)

## 2014-06-19 LAB — LIPASE, BLOOD: Lipase: 442 U/L — ABNORMAL HIGH (ref 73–393)

## 2014-06-19 SURGERY — LAPAROTOMY, EXPLORATORY
Anesthesia: General

## 2014-06-19 MED ORDER — ONDANSETRON HCL 4 MG/2ML IJ SOLN
4.0000 mg | Freq: Once | INTRAMUSCULAR | Status: AC
  Administered 2014-06-19: 4 mg via INTRAVENOUS

## 2014-06-19 MED ORDER — ONDANSETRON HCL 4 MG/2ML IJ SOLN
INTRAMUSCULAR | Status: AC
  Filled 2014-06-19: qty 2

## 2014-06-19 MED ORDER — MORPHINE SULFATE 4 MG/ML IJ SOLN: 4.0000 mg | Freq: Once | INTRAMUSCULAR | Status: AC

## 2014-06-19 MED ORDER — MORPHINE SULFATE 2 MG/ML IJ SOLN
INTRAMUSCULAR | Status: AC | PRN
  Administered 2014-06-19: 4 mg via INTRAVENOUS

## 2014-06-19 MED ORDER — MORPHINE SULFATE 2 MG/ML IJ SOLN
INTRAMUSCULAR | Status: AC
  Filled 2014-06-19: qty 2

## 2014-06-19 NOTE — ED Notes (Signed)
Attempted report 
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
## 2014-06-20 ENCOUNTER — Inpatient Hospital Stay (HOSPITAL_COMMUNITY): Payer: Federal, State, Local not specified - PPO

## 2014-06-20 ENCOUNTER — Encounter (HOSPITAL_COMMUNITY): Payer: Self-pay | Admitting: *Deleted

## 2014-06-20 DIAGNOSIS — N179 Acute kidney failure, unspecified: Secondary | ICD-10-CM | POA: Diagnosis present

## 2014-06-20 DIAGNOSIS — S37002A Unspecified injury of left kidney, initial encounter: Secondary | ICD-10-CM

## 2014-06-20 DIAGNOSIS — E785 Hyperlipidemia, unspecified: Secondary | ICD-10-CM

## 2014-06-20 DIAGNOSIS — E875 Hyperkalemia: Secondary | ICD-10-CM | POA: Diagnosis present

## 2014-06-20 LAB — CBC
HCT: 22.1 % — ABNORMAL LOW (ref 39.0–52.0)
HCT: 18.9 % — ABNORMAL LOW (ref 39.0–52.0)
HEMATOCRIT: 26.2 % — AB (ref 39.0–52.0)
HEMATOCRIT: 27.1 % — AB (ref 39.0–52.0)
HEMOGLOBIN: 7.8 g/dL — AB (ref 13.0–17.0)
HEMOGLOBIN: 6.5 g/dL — AB (ref 13.0–17.0)
Hemoglobin: 9.1 g/dL — ABNORMAL LOW (ref 13.0–17.0)
Hemoglobin: 9.2 g/dL — ABNORMAL LOW (ref 13.0–17.0)
MCH: 31.3 pg (ref 26.0–34.0)
MCH: 32.6 pg (ref 26.0–34.0)
MCH: 32.7 pg (ref 26.0–34.0)
MCH: 34.1 pg — AB (ref 26.0–34.0)
MCHC: 33.9 g/dL (ref 30.0–36.0)
MCHC: 34.4 g/dL (ref 30.0–36.0)
MCHC: 34.7 g/dL (ref 30.0–36.0)
MCHC: 35.3 g/dL (ref 30.0–36.0)
MCV: 90 fL (ref 78.0–100.0)
MCV: 95 fL (ref 78.0–100.0)
MCV: 96.1 fL (ref 78.0–100.0)
MCV: 96.5 fL (ref 78.0–100.0)
Platelets: 146 10*3/uL — ABNORMAL LOW (ref 150–400)
Platelets: 166 10*3/uL (ref 150–400)
Platelets: 177 10*3/uL (ref 150–400)
Platelets: 214 10*3/uL (ref 150–400)
RBC: 1.99 MIL/uL — AB (ref 4.22–5.81)
RBC: 2.29 MIL/uL — ABNORMAL LOW (ref 4.22–5.81)
RBC: 2.82 MIL/uL — AB (ref 4.22–5.81)
RBC: 2.91 MIL/uL — ABNORMAL LOW (ref 4.22–5.81)
RDW: 13 % (ref 11.5–15.5)
RDW: 13.1 % (ref 11.5–15.5)
RDW: 13.3 % (ref 11.5–15.5)
RDW: 15.1 % (ref 11.5–15.5)
WBC: 10.1 10*3/uL (ref 4.0–10.5)
WBC: 11.2 10*3/uL — ABNORMAL HIGH (ref 4.0–10.5)
WBC: 12.1 10*3/uL — AB (ref 4.0–10.5)
WBC: 8.7 10*3/uL (ref 4.0–10.5)

## 2014-06-20 LAB — URINALYSIS, ROUTINE W REFLEX MICROSCOPIC
Bilirubin Urine: NEGATIVE
Glucose, UA: 500 mg/dL — AB
Ketones, ur: NEGATIVE mg/dL
Leukocytes, UA: NEGATIVE
NITRITE: NEGATIVE
Protein, ur: NEGATIVE mg/dL
SPECIFIC GRAVITY, URINE: 1.037 — AB (ref 1.005–1.030)
UROBILINOGEN UA: 0.2 mg/dL (ref 0.0–1.0)
pH: 5 (ref 5.0–8.0)

## 2014-06-20 LAB — GLUCOSE, CAPILLARY
GLUCOSE-CAPILLARY: 117 mg/dL — AB (ref 70–99)
GLUCOSE-CAPILLARY: 168 mg/dL — AB (ref 70–99)
GLUCOSE-CAPILLARY: 171 mg/dL — AB (ref 70–99)
GLUCOSE-CAPILLARY: 185 mg/dL — AB (ref 70–99)
GLUCOSE-CAPILLARY: 551 mg/dL — AB (ref 70–99)
Glucose-Capillary: 579 mg/dL (ref 70–99)
Glucose-Capillary: >600 mg/dL (ref 70–99)
Glucose-Capillary: 410 mg/dL — ABNORMAL HIGH (ref 70–99)
Glucose-Capillary: 131 mg/dL — ABNORMAL HIGH (ref 70–99)
Glucose-Capillary: 146 mg/dL — ABNORMAL HIGH (ref 70–99)
Glucose-Capillary: 161 mg/dL — ABNORMAL HIGH (ref 70–99)
Glucose-Capillary: 154 mg/dL — ABNORMAL HIGH (ref 70–99)
Glucose-Capillary: 105 mg/dL — ABNORMAL HIGH (ref 70–99)
Glucose-Capillary: 140 mg/dL — ABNORMAL HIGH (ref 70–99)
Glucose-Capillary: 141 mg/dL — ABNORMAL HIGH (ref 70–99)
Glucose-Capillary: 183 mg/dL — ABNORMAL HIGH (ref 70–99)
Glucose-Capillary: 92 mg/dL (ref 70–99)
Glucose-Capillary: 93 mg/dL (ref 70–99)

## 2014-06-20 LAB — BASIC METABOLIC PANEL
ANION GAP: 12 (ref 5–15)
Anion gap: 19 — ABNORMAL HIGH (ref 5–15)
BUN: 21 mg/dL (ref 6–23)
BUN: 21 mg/dL (ref 6–23)
CALCIUM: 10.5 mg/dL (ref 8.4–10.5)
CO2: 14 mEq/L — ABNORMAL LOW (ref 19–32)
CO2: 22 mEq/L (ref 19–32)
CREATININE: 1.36 mg/dL — AB (ref 0.50–1.35)
CREATININE: 1.49 mg/dL — AB (ref 0.50–1.35)
Calcium: 10.3 mg/dL (ref 8.4–10.5)
Chloride: 99 mEq/L (ref 96–112)
Chloride: 103 mEq/L (ref 96–112)
GFR calc Af Amer: 55 mL/min — ABNORMAL LOW (ref >90)
GFR calc non Af Amer: 48 mL/min — ABNORMAL LOW (ref >90)
GFR, EST AFRICAN AMERICAN: 62 mL/min — AB (ref >90)
GFR, EST NON AFRICAN AMERICAN: 53 mL/min — AB (ref >90)
Glucose, Bld: 581 mg/dL (ref 70–99)
Glucose, Bld: 417 mg/dL — ABNORMAL HIGH (ref 70–99)
Potassium: 6.4 mEq/L — ABNORMAL HIGH (ref 3.7–5.3)
Potassium: 5 mEq/L (ref 3.7–5.3)
Sodium: 132 mEq/L — ABNORMAL LOW (ref 137–147)
Sodium: 137 mEq/L (ref 137–147)

## 2014-06-20 LAB — URINE MICROSCOPIC-ADD ON

## 2014-06-20 LAB — PREPARE RBC (CROSSMATCH)

## 2014-06-20 LAB — HEMOGLOBIN A1C
Hgb A1c MFr Bld: 11.5 % — ABNORMAL HIGH (ref <5.7)
Mean Plasma Glucose: 283 mg/dL — ABNORMAL HIGH (ref <117)

## 2014-06-20 LAB — MRSA PCR SCREENING: MRSA BY PCR: NEGATIVE

## 2014-06-20 LAB — CREATININE, URINE, RANDOM: Creatinine, Urine: 127.9 mg/dL

## 2014-06-20 LAB — ABO/RH: ABO/RH(D): O POS

## 2014-06-20 LAB — OSMOLALITY, URINE: OSMOLALITY UR: 540 mosm/kg (ref 390–1090)

## 2014-06-20 LAB — OSMOLALITY: OSMOLALITY: 292 mosm/kg (ref 275–300)

## 2014-06-20 LAB — SODIUM, URINE, RANDOM: Sodium, Ur: 47 mEq/L

## 2014-06-20 MED ORDER — SODIUM CHLORIDE 0.9 % IV SOLN: INTRAVENOUS | Status: DC

## 2014-06-20 MED ORDER — POTASSIUM CHLORIDE IN NACL 20-0.9 MEQ/L-% IV SOLN
INTRAVENOUS | Status: DC
  Administered 2014-06-20: 02:00:00 via INTRAVENOUS
  Filled 2014-06-20 (×2): qty 1000

## 2014-06-20 MED ORDER — SODIUM CHLORIDE 0.9 % IV BOLUS (SEPSIS)
1000.0000 mL | Freq: Once | INTRAVENOUS | Status: AC
  Administered 2014-06-20: 1000 mL via INTRAVENOUS

## 2014-06-20 MED ORDER — FENTANYL CITRATE 0.05 MG/ML IJ SOLN
INTRAMUSCULAR | Status: AC
  Filled 2014-06-20: qty 4

## 2014-06-20 MED ORDER — INSULIN GLARGINE 100 UNIT/ML ~~LOC~~ SOLN: 25.0000 [IU] | Freq: Two times a day (BID) | SUBCUTANEOUS | Status: DC

## 2014-06-20 MED ORDER — INSULIN ASPART 100 UNIT/ML ~~LOC~~ SOLN
10.0000 [IU] | Freq: Once | SUBCUTANEOUS | Status: AC
  Administered 2014-06-20: 10 [IU] via SUBCUTANEOUS

## 2014-06-20 MED ORDER — PANTOPRAZOLE SODIUM 40 MG IV SOLR
40.0000 mg | Freq: Every day | INTRAVENOUS | Status: DC
  Administered 2014-06-20: 40 mg via INTRAVENOUS
  Filled 2014-06-20 (×4): qty 40

## 2014-06-20 MED ORDER — INSULIN ASPART 100 UNIT/ML ~~LOC~~ SOLN
0.0000 [IU] | SUBCUTANEOUS | Status: DC
Start: 2014-06-20 — End: 2014-06-20

## 2014-06-20 MED ORDER — DEXTROSE 50 % IV SOLN: 25.0000 mL | INTRAVENOUS | Status: DC | PRN

## 2014-06-20 MED ORDER — FENTANYL CITRATE 0.05 MG/ML IJ SOLN
INTRAMUSCULAR | Status: AC | PRN
  Administered 2014-06-20: 50 ug via INTRAVENOUS

## 2014-06-20 MED ORDER — ONDANSETRON HCL 4 MG/2ML IJ SOLN
4.0000 mg | Freq: Four times a day (QID) | INTRAMUSCULAR | Status: DC | PRN
  Administered 2014-06-20 (×2): 4 mg via INTRAVENOUS
  Filled 2014-06-20 (×2): qty 2

## 2014-06-20 MED ORDER — INSULIN ASPART 100 UNIT/ML ~~LOC~~ SOLN: 0.0000 [IU] | SUBCUTANEOUS | Status: DC

## 2014-06-20 MED ORDER — DEXTROSE 5 % IV SOLN
INTRAVENOUS | Status: AC
  Administered 2014-06-20: 10:00:00 via INTRAVENOUS
  Administered 2014-06-20: 75 mL via INTRAVENOUS

## 2014-06-20 MED ORDER — INSULIN REGULAR BOLUS VIA INFUSION
0.0000 [IU] | Freq: Three times a day (TID) | INTRAVENOUS | Status: DC
  Administered 2014-06-20: 3 [IU] via INTRAVENOUS
  Administered 2014-06-21: 5.1 [IU] via INTRAVENOUS
  Filled 2014-06-20: qty 10

## 2014-06-20 MED ORDER — CETYLPYRIDINIUM CHLORIDE 0.05 % MT LIQD
7.0000 mL | Freq: Two times a day (BID) | OROMUCOSAL | Status: DC
  Administered 2014-06-21: 7 mL via OROMUCOSAL

## 2014-06-20 MED ORDER — INSULIN REGULAR HUMAN 100 UNIT/ML IJ SOLN
INTRAMUSCULAR | Status: DC
  Filled 2014-06-20: qty 2.5

## 2014-06-20 MED ORDER — SODIUM CHLORIDE 0.9 % IV SOLN
INTRAVENOUS | Status: DC
  Administered 2014-06-20: 05:00:00 via INTRAVENOUS

## 2014-06-20 MED ORDER — INSULIN GLARGINE 100 UNIT/ML ~~LOC~~ SOLN
10.0000 [IU] | Freq: Two times a day (BID) | SUBCUTANEOUS | Status: DC
  Administered 2014-06-20 – 2014-06-21 (×2): 10 [IU] via SUBCUTANEOUS
  Filled 2014-06-20 (×4): qty 0.1

## 2014-06-20 MED ORDER — INFLUENZA VAC SPLIT QUAD 0.5 ML IM SUSY
0.5000 mL | PREFILLED_SYRINGE | INTRAMUSCULAR | Status: AC
  Administered 2014-06-21: 0.5 mL via INTRAMUSCULAR
  Filled 2014-06-20: qty 0.5

## 2014-06-20 MED ORDER — MIDAZOLAM HCL 2 MG/2ML IJ SOLN
INTRAMUSCULAR | Status: AC
  Filled 2014-06-20: qty 4

## 2014-06-20 MED ORDER — MORPHINE SULFATE 2 MG/ML IJ SOLN
2.0000 mg | INTRAMUSCULAR | Status: DC | PRN
  Administered 2014-06-20: 2 mg via INTRAVENOUS
  Administered 2014-06-20 – 2014-06-21 (×3): 4 mg via INTRAVENOUS
  Administered 2014-06-21: 2 mg via INTRAVENOUS
  Administered 2014-06-21 (×2): 4 mg via INTRAVENOUS
  Administered 2014-06-21: 2 mg via INTRAVENOUS
  Administered 2014-06-21: 4 mg via INTRAVENOUS
  Filled 2014-06-20: qty 2
  Filled 2014-06-20: qty 1
  Filled 2014-06-20 (×2): qty 2
  Filled 2014-06-20: qty 1
  Filled 2014-06-20: qty 2
  Filled 2014-06-20: qty 1
  Filled 2014-06-20 (×2): qty 2

## 2014-06-20 MED ORDER — INSULIN GLARGINE 100 UNIT/ML ~~LOC~~ SOLN
15.0000 [IU] | Freq: Every day | SUBCUTANEOUS | Status: DC
  Filled 2014-06-20: qty 0.15

## 2014-06-20 MED ORDER — INSULIN ASPART 100 UNIT/ML ~~LOC~~ SOLN
20.0000 [IU] | Freq: Once | SUBCUTANEOUS | Status: AC
  Administered 2014-06-20: 20 [IU] via SUBCUTANEOUS

## 2014-06-20 MED ORDER — INSULIN ASPART 100 UNIT/ML ~~LOC~~ SOLN: 20.0000 [IU] | Freq: Once | SUBCUTANEOUS | Status: DC

## 2014-06-20 MED ORDER — SODIUM CHLORIDE 0.9 % IV SOLN
INTRAVENOUS | Status: DC
  Administered 2014-06-20: 1 [IU]/h via INTRAVENOUS
  Filled 2014-06-20: qty 2.5

## 2014-06-20 MED ORDER — SODIUM CHLORIDE 0.9 % IV SOLN
INTRAVENOUS | Status: DC
  Administered 2014-06-21: 16:00:00 via INTRAVENOUS
  Administered 2014-06-21: 10 mL/h via INTRAVENOUS

## 2014-06-20 MED ORDER — LIDOCAINE HCL 1 % IJ SOLN
INTRAMUSCULAR | Status: AC
  Filled 2014-06-20: qty 20

## 2014-06-20 MED ORDER — INSULIN GLARGINE 100 UNIT/ML ~~LOC~~ SOLN
12.0000 [IU] | Freq: Every day | SUBCUTANEOUS | Status: DC
  Administered 2014-06-20: 12 [IU] via SUBCUTANEOUS
  Filled 2014-06-20: qty 0.12

## 2014-06-20 MED ORDER — PANTOPRAZOLE SODIUM 40 MG PO TBEC
40.0000 mg | DELAYED_RELEASE_TABLET | Freq: Every day | ORAL | Status: DC
  Administered 2014-06-21: 40 mg via ORAL
  Filled 2014-06-20: qty 1

## 2014-06-20 MED ORDER — ONDANSETRON HCL 4 MG PO TABS
4.0000 mg | ORAL_TABLET | Freq: Four times a day (QID) | ORAL | Status: DC | PRN
Start: 2014-06-20 — End: 2014-06-24

## 2014-06-20 MED ORDER — CHLORHEXIDINE GLUCONATE 0.12 % MT SOLN
15.0000 mL | Freq: Two times a day (BID) | OROMUCOSAL | Status: DC
  Administered 2014-06-21 (×2): 15 mL via OROMUCOSAL
  Filled 2014-06-20 (×2): qty 15

## 2014-06-20 MED ORDER — SODIUM CHLORIDE 0.9 % IV SOLN
Freq: Once | INTRAVENOUS | Status: AC
  Administered 2014-06-20: 16:00:00 via INTRAVENOUS

## 2014-06-20 MED ORDER — MIDAZOLAM HCL 2 MG/2ML IJ SOLN
INTRAMUSCULAR | Status: AC | PRN
  Administered 2014-06-20: 1 mg via INTRAVENOUS

## 2014-06-20 MED ORDER — IOHEXOL 300 MG/ML  SOLN
150.0000 mL | Freq: Once | INTRAMUSCULAR | Status: AC | PRN
Start: 2014-06-20 — End: 2014-06-20
  Administered 2014-06-20: 30 mL via INTRA_ARTERIAL

## 2014-06-20 NOTE — Progress Notes (Signed)
Pt's CBG on arrival to unit was 579. Order placed for stat lab verification and MD notified. Order received for 20 units Novolog and recheck in 1 hour.   Alfonso EllisJessie Chenoa Luddy, RN
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
## 2014-06-20 NOTE — Progress Notes (Signed)
CRITICAL VALUE ALERT  Critical value received: Glucose 581 (consistent with previously reported CBG of 579 but still notified MD of persistent hyperglycemia)  Date of notification: 06/20/14  Time of notification: 0235  Critical value read back: yes  Nurse who received alert: Alfonso EllisJessie Michial Disney, RN  MD notified (1st page): Dr. Corliss Skainssuei  Time of first page: 0300  MD notified (2nd page):  Time of second page:  Responding MD: Dr. Corliss Skainssuei  Time MD responded: 0300

## 2014-06-20 NOTE — Sedation Documentation (Signed)
Patient is resting comfortably. 

## 2014-06-20 NOTE — Procedures (Signed)
Post left renal arteriogram.  No immediate complications.  Keep right leg straight for 2 hrs.

## 2014-06-20 NOTE — Progress Notes (Signed)
Pt's CBG was 551 one hour after giving 20 units of insulin. Also, BP was 130s/80s now 90s/60s, HR in low 100s, Hbg 9.2, K 6.4. MD notified of results. Orders received for 20 units Novolog insulin, 1 L NS bolus, change IVF to NS at 100 cc/hr, and repeat BMET.  Alfonso EllisJessie Zadin Lange, RN

## 2014-06-20 NOTE — Sedation Documentation (Signed)
Jackson [name], Rad tech, held manual pressure to Rt groin and to place exoseal per order Dr. Grace IsaacWatts.
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
## 2014-06-20 NOTE — Sedation Documentation (Signed)
Pt instructed to hold breath for pictures per Dr. Grace IsaacWatts.  Followed commands w/ no difficulty.

## 2014-06-20 NOTE — Progress Notes (Signed)
Trauma Service Note  Subjective: Patient doing fine, seems hemodynamically stable in spite of a 2.5 drop in hemoglobin.  Platelet count is stable  Objective: Vital signs in last 24 hours: Temp:  [97.2 F (36.2 C)-98.7 F (37.1 C)] 97.2 F (36.2 C) (10/22 0100) Pulse Rate:  [64-110] 73 (10/22 0700) Resp:  [12-28] 13 (10/22 0700) BP: (105-162)/(59-90) 112/62 mmHg (10/22 0700) SpO2:  [98 %-100 %] 98 % (10/22 0700) Weight:  [74.844 kg (165 lb)-75 kg (165 lb 5.5 oz)] 75 kg (165 lb 5.5 oz) (10/22 0000)    Intake/Output from previous day: 10/21 0701 - 10/22 0700 In: 2452.5 [I.V.:452.5; IV Piggyback:2000] Out: 1000 [Urine:1000] Intake/Output this shift:    General: No acute distress  Lungs: Clear  Abd: Soft, mildly tender on the left side.  Extremities: No changes  Neuro: Intact  Lab Results: CBC   Recent Labs  06/19/14 2150 06/19/14 2153 06/20/14 0046  WBC 11.4*  --  12.1*  HGB 10.6* 11.6* 9.2*  HCT 31.1* 34.0* 27.1*  PLT 232  --  214   BMET  Recent Labs  06/20/14 0046 06/20/14 0417  NA 132* 137  K 6.4* 5.0  CL 99 103  CO2 14* 22  GLUCOSE 581* 417*  BUN 21 21  CREATININE 1.36* 1.49*  CALCIUM 10.3 10.5   PT/INR  Recent Labs  06/19/14 2150  LABPROT 13.8  INR 1.05   ABG No results found for this basename: PHART, PCO2, PO2, HCO3,  in the last 72 hours  Studies/Results: No results found.  Anti-infectives: Anti-infectives   None      Assessment/Plan: s/p  Advance diet Diabetic clear liquids Keep at bedrest.  LOS: 1 day   Jackson LamasJames O. Gae [name], III, MD, FACS [phone] Trauma Surgeon 06/20/2014

## 2014-06-20 NOTE — Progress Notes (Signed)
Inpatient Diabetes Program Recommendations  AACE/ADA: New Consensus Statement on Inpatient Glycemic Control (2013)  Target Ranges:  Prepandial:   less than 140 mg/dL      Peak postprandial:   less than 180 mg/dL (1-2 hours)      Critically ill patients:  140 - 180 mg/dL   Reason for Assessment:  [facility] [place] [facility] (MRN [id number]) as of 06/20/2014 12:59  Ref. Range 06/20/2014 01:35 06/20/2014 02:39 06/20/2014 03:25 06/20/2014 07:52 06/20/2014 08:50 06/20/2014 10:10 06/20/2014 11:46  Glucose-Capillary Latest Range: 70-99 mg/dL >875>600 (HH) 643551 (HH) 329410 (H) 185 (H) 161 (H) 131 (H) 146 (H)   Diabetes history: Type 1 Diabetes Outpatient Diabetes medications: According to medication reconciliation, patient was on Lantus 30 units q HS and Novolog 8-10 units with meals Current orders for Inpatient glycemic control: Insulin gtt., Lantus 10 units bid, CHO coverage for PO intake  When patient more stable, may resume home insulin regimen.  Agree with insulin drip for now.   Will follow. Jackson MeagerJenny Dillie Burandt, RN, BC-ADM Inpatient Diabetes Coordinator Pager [phone]

## 2014-06-20 NOTE — Sedation Documentation (Signed)
Patient denies pain and is resting comfortably.  

## 2014-06-20 NOTE — Progress Notes (Signed)
UR completed.  Torben Soloway, RN BSN M[facility] CCM Trauma/Neuro ICU Case Manager [phone]  

## 2014-06-20 NOTE — Sedation Documentation (Signed)
Report received from Jeannett SeniorStephen, [place]RN, who brought pt down from room 2S07.
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
## 2014-06-20 NOTE — Sedation Documentation (Signed)
Dr. Grace IsaacWatts in to s/w pt and answer any questions.
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
## 2014-06-20 NOTE — Sedation Documentation (Signed)
Blood sugar checked 163.
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
## 2014-06-20 NOTE — Progress Notes (Signed)
  Subjective: BP trending down somewhat this am to 100/60 but back up to 110s/60s on exam. HR stable in the 70s-80s.  Doing well without complains. Pain much improved.   Objective: Vital signs in last 24 hours: Temp:  [97.2 F (36.2 [name])-98.7 F (37.1 [name])] 97.2 F (36.2 [name]) (10/22 0100) Pulse Rate:  [64-110] 78 (10/22 0600) Resp:  [12-28] 19 (10/22 0600) BP: (105-162)/(59-90) 108/62 mmHg (10/22 0600) SpO2:  [98 %-100 %] 98 % (10/22 0600) Weight:  [74.844 kg (165 lb)-75 kg (165 lb 5.5 oz)] 75 kg (165 lb 5.5 oz) (10/22 0000)  Intake/Output from previous day: 10/21 0701 - 10/22 0700 In: 2340 [I.V.:340; IV Piggyback:2000] Out: 500 [Urine:500] Intake/Output this shift: Total I/O In: 2340 [I.V.:340; IV Piggyback:2000] Out: 500 [Urine:500]  Physical Exam:  General: Alert and oriented CV: RRR Lungs: Clear Abdomen: Soft, ND Flank: TTP left flank Urine: clear Ext: NT, No erythema  Lab Results:  Recent Labs  06/19/14 2150 06/19/14 2153 06/20/14 0046  HGB 10.6* 11.6* 9.2*  HCT 31.1* 34.0* 27.1*   BMET  Recent Labs  06/20/14 0046 06/20/14 0417  NA 132* 137  K 6.4* 5.0  CL 99 103  CO2 14* 22  GLUCOSE 581* 417*  BUN 21 21  CREATININE 1.36* 1.49*  CALCIUM 10.3 10.5     Studies/Results: No results found.  Assessment/Plan: HD#1 from left grade 4 renal laceration with perinephric hematoma. Initial conservative management due to hemodynamic stability. Hgb trending down 12-10.6-9.2  -- Continue to obtain q6 hr serial H/H & bedrest -- Monitor for signs of hemodynamic instability -- Foley is preferable to monitor UOP, but ok with voiding in urinal as he is doing well with this and would like to avoid a foley if possible -- Would likely consider transfusion is Hgb continues to trend down prior to exploration due to high chance of nephrectomy with surgical intervention -- Daily BMP -- Will discuss reimaging in the next couple of days   LOS: 1 day   Jackson [name], Jackson  [name] 06/20/2014, 6:29 AM

## 2014-06-20 NOTE — Progress Notes (Addendum)
Updated Dr. Thedore MinsSingh regarding CBG of 161 upon starting the insulin gtt. Patient ate 100% meal and received 3 units per the CHO coverage in the Glucose stabilizer. Orders received to hold the gtt for now and restart at 1100 per ordered protocol and to change MIVF to D5W if CBG<150.

## 2014-06-20 NOTE — Sedation Documentation (Signed)
Pt notably pale w/ poor capillary return to nailbeds.

## 2014-06-20 NOTE — Progress Notes (Signed)
Pt's CBG rechecked prior to giving 20 units of insulin and it was 410. Dr. Clyde LundborgNiu at bedside. Pt will receive 12 units of Lantus and 10 units of Novolog instead of 20 units. BMET will be repeated to check potassium. 12 lead EKG done. Pt will receive another 1 L NS bolus.  Alfonso EllisJessie Jalene Demo, RN

## 2014-06-20 NOTE — Progress Notes (Signed)
CRITICAL VALUE ALERT  Critical value received:  Hgb 6.5  Date of notification:  06/20/14  Time of notification:  1410  Critical value read back:Yes.    Nurse who received alert:  Cyril LoosenMargaret Prescilla Monger, RN   MD notified (1st page):  Trauma MD pager  Time of first page:  1414  MD notified (2nd page):  Time of second page:  Responding MD:  Jimmye NormanJames Wyatt  Time MD responded:  [phone]

## 2014-06-20 NOTE — Sedation Documentation (Signed)
Nurse, Viviann SpareSteven, arrived to assist w/ transport of pt on monitor back to room 2S07.  Rt groin w/out bleeding to site or hematoma with 1+ Dorsalis pedis pulse. We looked at groin and pulse together.  Report given to Viviann SpareSteven of BS- 163 and CBC drawn.

## 2014-06-20 NOTE — Consult Note (Signed)
Procedure again discussed in detail with the pt. Pt remains agreeable to proceed.
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
## 2014-06-20 NOTE — Progress Notes (Signed)
Called recent Hgb results and current vital signs to Dr. Lindie SpruceWyatt d/t a further drop in Hgb. Patient asymptomatic lying in bed with HR 60-80s, BP 103/50 (70) currently. Will continue to monitor patient's hemodynamics.

## 2014-06-20 NOTE — Sedation Documentation (Signed)
Blood obtained for q6h CBC.

## 2014-06-21 LAB — TYPE AND SCREEN
ABO/RH(D): O POS
Antibody Screen: NEGATIVE
Unit division: 0
Unit division: 0

## 2014-06-21 LAB — BASIC METABOLIC PANEL
Anion gap: 8 (ref 5–15)
BUN: 16 mg/dL (ref 6–23)
CHLORIDE: 103 meq/L (ref 96–112)
CO2: 26 mEq/L (ref 19–32)
Calcium: 11.1 mg/dL — ABNORMAL HIGH (ref 8.4–10.5)
Creatinine, Ser: 1.12 mg/dL (ref 0.50–1.35)
GFR calc Af Amer: 78 mL/min — ABNORMAL LOW (ref >90)
GFR calc non Af Amer: 68 mL/min — ABNORMAL LOW (ref >90)
Glucose, Bld: 94 mg/dL (ref 70–99)
POTASSIUM: 4.7 meq/L (ref 3.7–5.3)
Sodium: 137 mEq/L (ref 137–147)

## 2014-06-21 LAB — CBC
HEMATOCRIT: 26.4 % — AB (ref 39.0–52.0)
HEMOGLOBIN: 9.2 g/dL — AB (ref 13.0–17.0)
MCH: 31.4 pg (ref 26.0–34.0)
MCHC: 34.8 g/dL (ref 30.0–36.0)
MCV: 90.1 fL (ref 78.0–100.0)
Platelets: 153 10*3/uL (ref 150–400)
RBC: 2.93 MIL/uL — ABNORMAL LOW (ref 4.22–5.81)
RDW: 15.4 % (ref 11.5–15.5)
WBC: 10.3 10*3/uL (ref 4.0–10.5)

## 2014-06-21 LAB — GLUCOSE, CAPILLARY
GLUCOSE-CAPILLARY: 94 mg/dL (ref 70–99)
GLUCOSE-CAPILLARY: 88 mg/dL (ref 70–99)
GLUCOSE-CAPILLARY: 79 mg/dL (ref 70–99)
GLUCOSE-CAPILLARY: 87 mg/dL (ref 70–99)
GLUCOSE-CAPILLARY: 87 mg/dL (ref 70–99)
GLUCOSE-CAPILLARY: 189 mg/dL — AB (ref 70–99)
Glucose-Capillary: 105 mg/dL — ABNORMAL HIGH (ref 70–99)
Glucose-Capillary: 118 mg/dL — ABNORMAL HIGH (ref 70–99)
Glucose-Capillary: 176 mg/dL — ABNORMAL HIGH (ref 70–99)
Glucose-Capillary: 190 mg/dL — ABNORMAL HIGH (ref 70–99)
Glucose-Capillary: 67 mg/dL — ABNORMAL LOW (ref 70–99)
Glucose-Capillary: 93 mg/dL (ref 70–99)
Glucose-Capillary: 97 mg/dL (ref 70–99)
Glucose-Capillary: 98 mg/dL (ref 70–99)
Glucose-Capillary: 98 mg/dL (ref 70–99)

## 2014-06-21 LAB — CBC WITH DIFFERENTIAL/PLATELET
BASOS PCT: 0 % (ref 0–1)
Basophils Absolute: 0 10*3/uL (ref 0.0–0.1)
Eosinophils Absolute: 0 10*3/uL (ref 0.0–0.7)
Eosinophils Relative: 0 % (ref 0–5)
HCT: 24.8 % — ABNORMAL LOW (ref 39.0–52.0)
HEMOGLOBIN: 8.5 g/dL — AB (ref 13.0–17.0)
LYMPHS PCT: 17 % (ref 12–46)
Lymphs Abs: 1.2 10*3/uL (ref 0.7–4.0)
MCH: 30.9 pg (ref 26.0–34.0)
MCHC: 34.3 g/dL (ref 30.0–36.0)
MCV: 90.2 fL (ref 78.0–100.0)
Monocytes Absolute: 0.5 10*3/uL (ref 0.1–1.0)
Monocytes Relative: 7 % (ref 3–12)
Neutro Abs: 5.6 10*3/uL (ref 1.7–7.7)
Neutrophils Relative %: 76 % (ref 43–77)
Platelets: 135 10*3/uL — ABNORMAL LOW (ref 150–400)
RBC: 2.75 MIL/uL — ABNORMAL LOW (ref 4.22–5.81)
RDW: 15.1 % (ref 11.5–15.5)
WBC: 7.3 10*3/uL (ref 4.0–10.5)

## 2014-06-21 LAB — URINE CULTURE

## 2014-06-21 LAB — HEMOGLOBIN AND HEMATOCRIT, BLOOD
HCT: 26.1 % — ABNORMAL LOW (ref 39.0–52.0)
Hemoglobin: 9.1 g/dL — ABNORMAL LOW (ref 13.0–17.0)

## 2014-06-21 MED ORDER — INSULIN GLARGINE 100 UNIT/ML ~~LOC~~ SOLN
15.0000 [IU] | Freq: Two times a day (BID) | SUBCUTANEOUS | Status: DC
  Administered 2014-06-21 – 2014-06-24 (×6): 15 [IU] via SUBCUTANEOUS
  Filled 2014-06-21 (×8): qty 0.15

## 2014-06-21 MED ORDER — DEXTROSE 5 % IV SOLN
INTRAVENOUS | Status: DC
  Administered 2014-06-21 (×2): via INTRAVENOUS

## 2014-06-21 MED ORDER — INSULIN ASPART 100 UNIT/ML ~~LOC~~ SOLN
0.0000 [IU] | Freq: Three times a day (TID) | SUBCUTANEOUS | Status: DC
  Administered 2014-06-21: 3 [IU] via SUBCUTANEOUS

## 2014-06-21 MED ORDER — INSULIN ASPART 100 UNIT/ML ~~LOC~~ SOLN
0.0000 [IU] | Freq: Three times a day (TID) | SUBCUTANEOUS | Status: DC
  Administered 2014-06-22: 11 [IU] via SUBCUTANEOUS
  Administered 2014-06-23: 2 [IU] via SUBCUTANEOUS
  Administered 2014-06-23 – 2014-06-24 (×2): 3 [IU] via SUBCUTANEOUS

## 2014-06-21 MED ORDER — INSULIN ASPART 100 UNIT/ML ~~LOC~~ SOLN: 0.0000 [IU] | Freq: Every day | SUBCUTANEOUS | Status: DC
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
## 2014-06-21 NOTE — Progress Notes (Signed)
Inpatient Diabetes Program Recommendations  AACE/ADA: New Consensus Statement on Inpatient Glycemic Control (2013)  Target Ranges:  Prepandial:   less than 140 mg/dL      Peak postprandial:   less than 180 mg/dL (1-2 hours)      Critically ill patients:  140 - 180 mg/dL   Reason for Assessment:  Note patient transitioned off insulin drip.   Diabetes history: Type 1 diabetes  Once patient is eating, please consider adding Novolog meal coverage 4 units tid with meals to cover CHO intake.   Beryl MeagerJenny Trenia Tennyson, RN, BC-ADM Inpatient Diabetes Coordinator Pager [phone]

## 2014-06-22 ENCOUNTER — Encounter (HOSPITAL_COMMUNITY): Payer: Self-pay | Admitting: Radiology

## 2014-06-22 ENCOUNTER — Inpatient Hospital Stay (HOSPITAL_COMMUNITY): Payer: Federal, State, Local not specified - PPO

## 2014-06-22 DIAGNOSIS — E1041 Type 1 diabetes mellitus with diabetic mononeuropathy: Secondary | ICD-10-CM

## 2014-06-22 DIAGNOSIS — S2232XA Fracture of one rib, left side, initial encounter for closed fracture: Secondary | ICD-10-CM

## 2014-06-22 DIAGNOSIS — N179 Acute kidney failure, unspecified: Secondary | ICD-10-CM

## 2014-06-22 LAB — GLUCOSE, CAPILLARY
GLUCOSE-CAPILLARY: 116 mg/dL — AB (ref 70–99)
GLUCOSE-CAPILLARY: 195 mg/dL — AB (ref 70–99)
GLUCOSE-CAPILLARY: 123 mg/dL — AB (ref 70–99)
Glucose-Capillary: 325 mg/dL — ABNORMAL HIGH (ref 70–99)

## 2014-06-22 LAB — HEMOGLOBIN AND HEMATOCRIT, BLOOD
HCT: 24.2 % — ABNORMAL LOW (ref 39.0–52.0)
Hemoglobin: 8.6 g/dL — ABNORMAL LOW (ref 13.0–17.0)

## 2014-06-22 MED ORDER — MORPHINE SULFATE 2 MG/ML IJ SOLN
2.0000 mg | INTRAMUSCULAR | Status: DC | PRN
Start: 2014-06-22 — End: 2014-06-24

## 2014-06-22 MED ORDER — ATORVASTATIN CALCIUM 20 MG PO TABS
20.0000 mg | ORAL_TABLET | Freq: Every day | ORAL | Status: DC
  Administered 2014-06-22 – 2014-06-23 (×2): 20 mg via ORAL
  Filled 2014-06-22 (×3): qty 1

## 2014-06-22 MED ORDER — POLYETHYLENE GLYCOL 3350 17 G PO PACK
17.0000 g | PACK | Freq: Every day | ORAL | Status: DC
  Administered 2014-06-23 – 2014-06-24 (×2): 17 g via ORAL
  Filled 2014-06-22 (×4): qty 1

## 2014-06-22 MED ORDER — IOHEXOL 300 MG/ML  SOLN
80.0000 mL | Freq: Once | INTRAMUSCULAR | Status: AC | PRN
  Administered 2014-06-22: 80 mL via INTRAVENOUS

## 2014-06-22 MED ORDER — DOCUSATE SODIUM 100 MG PO CAPS
100.0000 mg | ORAL_CAPSULE | Freq: Two times a day (BID) | ORAL | Status: DC
  Administered 2014-06-22 – 2014-06-24 (×5): 100 mg via ORAL
  Filled 2014-06-22 (×5): qty 1

## 2014-06-22 MED ORDER — OXYCODONE HCL 5 MG PO TABS
5.0000 mg | ORAL_TABLET | ORAL | Status: DC | PRN
  Administered 2014-06-22 (×2): 10 mg via ORAL
  Administered 2014-06-22 – 2014-06-24 (×7): 15 mg via ORAL
  Filled 2014-06-22 (×3): qty 3
  Filled 2014-06-22 (×2): qty 2
  Filled 2014-06-22 (×4): qty 3

## 2014-06-22 MED ORDER — CITALOPRAM HYDROBROMIDE 40 MG PO TABS
40.0000 mg | ORAL_TABLET | Freq: Every day | ORAL | Status: DC
  Administered 2014-06-22 – 2014-06-24 (×3): 40 mg via ORAL
  Filled 2014-06-22 (×3): qty 1

## 2014-06-22 NOTE — Progress Notes (Signed)
Seen, agree with above.  Pain controlled. CT with renal delays today to eval for urine leak.  If none, may be able to go home in next day or so.

## 2014-06-22 NOTE — Progress Notes (Signed)
Transferred to [id number] via wheelchair. Report given to Noreene LarssonJill, Charity fundraiserN. No changes.

## 2014-06-22 NOTE — Progress Notes (Signed)
Patient ID: Jackson [name] Jackson [name], male   DOB: 01/01/1951, [age] y.o.   MRN: [id number]   LOS: 3 days   Subjective: Doing pretty well.   Objective: Vital signs in last 24 hours: Temp:  [98.2 F (36.8 C)-99 F (37.2 C)] 99 F (37.2 C) (10/23 2000) Pulse Rate:  [67-81] 68 (10/24 0400) Resp:  [10-20] 13 (10/24 0400) BP: (119-137)/(59-85) 135/70 mmHg (10/24 0400) SpO2:  [96 %-100 %] 98 % (10/24 0400)    IS: 1250ml   UOP: >11200ml/h   Laboratory CBC  Recent Labs  06/21/14 0300  06/21/14 1958 06/22/14 0224  WBC 10.3  --  7.3  --   HGB 9.2*  < > 8.5* 8.6*  HCT 26.4*  < > 24.8* 24.2*  PLT 153  --  135*  --   < > = values in this interval not displayed.  CBG (last 3)   Recent Labs  06/21/14 1534 06/21/14 2207 06/22/14 0750  GLUCAP 190* 176* 116*    Physical Exam General appearance: alert and no distress Resp: clear to auscultation bilaterally Cardio: regular rate and rhythm GI: normal findings: bowel sounds normal and soft, non-tender   Assessment/Plan: BCC Multiple left rib fxs -- Pulmonary toilet Grade 4 left renal lac -- Mobilize per urology ABL anemia -- Stable Multiple medical problems -- Home meds FEN -- Orals for pain VTE -- SCD's Dispo -- To floor     Freeman CaldronMichael J. Daisean Brodhead, PA-C Pager: [phone] General Trauma [place] Pager: [phone]  06/22/2014

## 2014-06-22 NOTE — Progress Notes (Signed)
Patient ID: Jackson StakesDavid A [name], male   DOB: 01/29/1951, 63 y.o.   MRN: [id number]    Subjective: Pt continues to remain hemodynamically stable.  Still complains of right chest pain with inspiration related to rib injuries. Denies flank or abdominal pain. He has been voiding grossly clear urine.  Objective: Vital signs in last 24 hours: Temp:  [98.2 F (36.8 C)-99 F (37.2 C)] 99 F (37.2 C) (10/23 2000) Pulse Rate:  [63-81] 68 (10/24 0400) Resp:  [10-20] 13 (10/24 0400) BP: (119-137)/(59-85) 135/70 mmHg (10/24 0400) SpO2:  [96 %-100 %] 98 % (10/24 0400)  Intake/Output from previous day: 10/23 0701 - 10/24 0700 In: 2067.6 [P.O.:1560; I.V.:507.6] Out: 2575 [Urine:2575] Intake/Output this shift:    Physical Exam:  General: Alert and oriented CV: RRR Abdomen: Soft, ND, No CVAT  Lab Results:  Recent Labs  06/21/14 1841 06/21/14 1958 06/22/14 0224  HGB 9.1* 8.5* 8.6*  HCT 26.1* 24.8* 24.2*   BMET  Recent Labs  06/20/14 0417 06/21/14 0300  NA 137 137  K 5.0 4.7  CL 103 103  CO2 22 26  GLUCOSE 417* 94  BUN 21 16  CREATININE 1.49* 1.12  CALCIUM 10.5 11.1*     Studies/Results:  Assessment/Plan: 1) Grade 4 renal laceration: Pt remains hemodynamically stable with stable Hgb last 24 hours and no hematuria.  He can begin ambulating from a GU standpoint now.  I would repeat CT of abdomen and pelvis with contrast and delayed images to assess for evidence of active extravasation from collecting system.  If no evidence of significant extravasation and Hgb remains stable for next 24 hrs, he may be able to be discharged with continued conservative management and outpatient follow up.    LOS: 3 days   Jackson [name],LES 06/22/2014, 7:50 AM

## 2014-06-22 NOTE — Progress Notes (Signed)
Temp 102.6 orally.  IS encouraged x10/ hour.  Pt coughing up thick tan colored sputum.  Dr. Donell BeersByerly paged.

## 2014-06-22 NOTE — Plan of Care (Signed)
Problem: Phase I Progression Outcomes Goal: OOB as tolerated unless otherwise ordered Outcome: Not Applicable Date Met:  71/21/97 Pt on strict B.R. Per MD order d/t grade 4 kidney lac.
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
## 2014-06-23 ENCOUNTER — Inpatient Hospital Stay (HOSPITAL_COMMUNITY): Payer: Federal, State, Local not specified - PPO

## 2014-06-23 LAB — URINE MICROSCOPIC-ADD ON

## 2014-06-23 LAB — URINALYSIS, ROUTINE W REFLEX MICROSCOPIC
Bilirubin Urine: NEGATIVE
GLUCOSE, UA: NEGATIVE mg/dL
Ketones, ur: NEGATIVE mg/dL
LEUKOCYTES UA: NEGATIVE
Nitrite: NEGATIVE
Protein, ur: NEGATIVE mg/dL
SPECIFIC GRAVITY, URINE: 1.01 (ref 1.005–1.030)
Urobilinogen, UA: 0.2 mg/dL (ref 0.0–1.0)
pH: 6 (ref 5.0–8.0)

## 2014-06-23 LAB — GLUCOSE, CAPILLARY
GLUCOSE-CAPILLARY: 203 mg/dL — AB (ref 70–99)
GLUCOSE-CAPILLARY: 93 mg/dL (ref 70–99)
GLUCOSE-CAPILLARY: 130 mg/dL — AB (ref 70–99)
GLUCOSE-CAPILLARY: 195 mg/dL — AB (ref 70–99)
Glucose-Capillary: 189 mg/dL — ABNORMAL HIGH (ref 70–99)

## 2014-06-23 LAB — CBC
HCT: 24.6 % — ABNORMAL LOW (ref 39.0–52.0)
Hemoglobin: 8.5 g/dL — ABNORMAL LOW (ref 13.0–17.0)
MCH: 30.9 pg (ref 26.0–34.0)
MCHC: 34.6 g/dL (ref 30.0–36.0)
MCV: 89.5 fL (ref 78.0–100.0)
PLATELETS: 147 10*3/uL — AB (ref 150–400)
RBC: 2.75 MIL/uL — ABNORMAL LOW (ref 4.22–5.81)
RDW: 13.8 % (ref 11.5–15.5)
WBC: 5.5 10*3/uL (ref 4.0–10.5)

## 2014-06-23 LAB — BASIC METABOLIC PANEL
ANION GAP: 10 (ref 5–15)
BUN: 14 mg/dL (ref 6–23)
CALCIUM: 10.6 mg/dL — AB (ref 8.4–10.5)
CO2: 25 mEq/L (ref 19–32)
Chloride: 98 mEq/L (ref 96–112)
Creatinine, Ser: 1.06 mg/dL (ref 0.50–1.35)
GFR, EST AFRICAN AMERICAN: 84 mL/min — AB (ref >90)
GFR, EST NON AFRICAN AMERICAN: 72 mL/min — AB (ref >90)
GLUCOSE: 84 mg/dL (ref 70–99)
POTASSIUM: 4 meq/L (ref 3.7–5.3)
SODIUM: 133 meq/L — AB (ref 137–147)
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
## 2014-06-24 DIAGNOSIS — S2242XA Multiple fractures of ribs, left side, initial encounter for closed fracture: Secondary | ICD-10-CM | POA: Diagnosis present

## 2014-06-24 DIAGNOSIS — D62 Acute posthemorrhagic anemia: Secondary | ICD-10-CM | POA: Diagnosis present

## 2014-06-24 LAB — CBC
HCT: 23.2 % — ABNORMAL LOW (ref 39.0–52.0)
Hemoglobin: 8.1 g/dL — ABNORMAL LOW (ref 13.0–17.0)
MCH: 31 pg (ref 26.0–34.0)
MCHC: 34.9 g/dL (ref 30.0–36.0)
MCV: 88.9 fL (ref 78.0–100.0)
PLATELETS: 152 10*3/uL (ref 150–400)
RBC: 2.61 MIL/uL — ABNORMAL LOW (ref 4.22–5.81)
RDW: 13.4 % (ref 11.5–15.5)
WBC: 5.4 10*3/uL (ref 4.0–10.5)

## 2014-06-24 LAB — GLUCOSE, CAPILLARY
Glucose-Capillary: 97 mg/dL (ref 70–99)
Glucose-Capillary: 165 mg/dL — ABNORMAL HIGH (ref 70–99)

## 2014-06-24 MED ORDER — OXYCODONE-ACETAMINOPHEN 7.5-325 MG PO TABS: 1.0000 | ORAL_TABLET | ORAL | Status: DC | PRN
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
## 2014-06-24 NOTE — Progress Notes (Signed)
Agree Derrik Mceachern, MD, MPH, FACS Trauma: [phone] General Surgery: [phone]  

## 2014-06-24 NOTE — Discharge Instructions (Signed)
No running, jumping, ball or contact sports, bikes, skateboards, motorcycles, etc for 3 months. ° °No driving while taking oxycodone. ° °

## 2014-06-24 NOTE — Discharge Summary (Signed)
Zi Sek, MD, MPH, FACS Trauma: [phone] General Surgery: [phone]  

## 2014-06-24 NOTE — Progress Notes (Signed)
Patient ID: Floyce StakesDavid A Bahri, male   DOB: 02/25/1951, 63 y.o.   MRN: [id number]   LOS: 5 days   Subjective: No new c/o. No further fevers.   Objective: Vital signs in last 24 hours: Temp:  [98 F (36.7 C)-100 F (37.8 C)] 100 F (37.8 C) (10/26 0601) Pulse Rate:  [65-68] 68 (10/26 0601) Resp:  [18] 18 (10/26 0601) BP: (129-134)/(67-78) 134/67 mmHg (10/26 0601) SpO2:  [97 %-100 %] 97 % (10/26 0601) Last BM Date: 06/18/14   Physical Exam General appearance: alert and no distress Resp: clear to auscultation bilaterally Cardio: regular rate and rhythm GI: normal findings: bowel sounds normal and soft, non-tender   Assessment/Plan: BCC  Multiple left rib fxs -- Pulmonary toilet  Grade 4 left renal lac -- Mobilize per urology  ABL anemia -- Stable  Multiple medical problems -- Home meds  FEN -- Orals for pain  VTE -- SCD's  Dispo -- To home    Freeman CaldronMichael J. Kaidence Callaway, PA-C Pager: [phone] General Trauma PA Pager: [phone]  06/24/2014

## 2014-06-25 ENCOUNTER — Other Ambulatory Visit: Payer: Self-pay | Admitting: Internal Medicine

## 2014-06-25 LAB — GLUCOSE, CAPILLARY: Glucose-Capillary: 163 mg/dL — ABNORMAL HIGH (ref 70–99)

## 2014-07-02 ENCOUNTER — Ambulatory Visit: Payer: Federal, State, Local not specified - PPO | Admitting: Endocrinology

## 2014-07-04 ENCOUNTER — Telehealth: Payer: Self-pay | Admitting: *Deleted

## 2014-07-04 NOTE — Telephone Encounter (Signed)
Sent mychart message
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
## 2014-07-08 NOTE — Telephone Encounter (Signed)
Spoke with patient, states he is doing better, just dealing with some cracked ribs. Did not have specific readings with him at this time. States he is averaging 230, not higher. States he has alarms sent for lower than 80 and above 180 and he is in that range 60-70% of the time. Scheduled an appointment 07/23/14, reminded to bring his meter with him.  verbalized understanding

## 2014-07-31 ENCOUNTER — Ambulatory Visit: Payer: Federal, State, Local not specified - PPO | Admitting: Endocrinology

## 2014-08-08 ENCOUNTER — Encounter: Payer: Self-pay | Admitting: Endocrinology

## 2014-08-08 ENCOUNTER — Ambulatory Visit (INDEPENDENT_AMBULATORY_CARE_PROVIDER_SITE_OTHER): Payer: Federal, State, Local not specified - PPO | Admitting: Endocrinology

## 2014-08-08 VITALS — BP 120/70 | HR 77 | Temp 99.4°F | Ht 74.0 in | Wt 163.2 lb

## 2014-08-08 DIAGNOSIS — E1049 Type 1 diabetes mellitus with other diabetic neurological complication: Secondary | ICD-10-CM

## 2014-08-08 DIAGNOSIS — E1041 Type 1 diabetes mellitus with diabetic mononeuropathy: Secondary | ICD-10-CM

## 2014-08-08 DIAGNOSIS — E1065 Type 1 diabetes mellitus with hyperglycemia: Principal | ICD-10-CM

## 2014-08-08 DIAGNOSIS — IMO0002 Reserved for concepts with insufficient information to code with codable children: Secondary | ICD-10-CM
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
## 2014-08-08 NOTE — Progress Notes (Signed)
Pre visit review using our clinic review tool, if applicable. No additional management support is needed unless otherwise documented below in the visit note. 

## 2014-08-08 NOTE — Patient Instructions (Addendum)
Check sugars 4 x daily ( before each meal and at bedtime).  Record them in a log book and bring that/meter to next appointment.   Change lantus to 34 units daily.  Start taking Novolog at the chart given at last visit. Take it with each meal.   Follow back with Dr Alphonsus SiasLetvak for depression.   Stop the diet tea and drink lots of water daily.  Labs today for calcium workup.  Please come back for a follow-up appointment in 6 weeks

## 2014-08-09 ENCOUNTER — Encounter: Payer: Self-pay | Admitting: *Deleted

## 2014-08-09 ENCOUNTER — Other Ambulatory Visit: Payer: Self-pay | Admitting: Endocrinology

## 2014-08-09 ENCOUNTER — Other Ambulatory Visit (INDEPENDENT_AMBULATORY_CARE_PROVIDER_SITE_OTHER): Payer: Federal, State, Local not specified - PPO

## 2014-08-09 LAB — VITAMIN D 25 HYDROXY (VIT D DEFICIENCY, FRACTURES): VITD: 10.86 ng/mL — AB (ref 30.00–100.00)

## 2014-08-09 LAB — COMPREHENSIVE METABOLIC PANEL
ALT: 25 U/L (ref 0–53)
AST: 26 U/L (ref 0–37)
Albumin: 3.6 g/dL (ref 3.5–5.2)
Alkaline Phosphatase: 165 U/L — ABNORMAL HIGH (ref 39–117)
BUN: 11 mg/dL (ref 6–23)
CHLORIDE: 97 meq/L (ref 96–112)
CO2: 29 mEq/L (ref 19–32)
Calcium: 12 mg/dL — ABNORMAL HIGH (ref 8.4–10.5)
Creatinine, Ser: 0.9 mg/dL (ref 0.4–1.5)
GFR: 96.32 mL/min (ref >60.00)
Glucose, Bld: 314 mg/dL — ABNORMAL HIGH (ref 70–99)
POTASSIUM: 5.2 meq/L — AB (ref 3.5–5.1)
Sodium: 130 mEq/L — ABNORMAL LOW (ref 135–145)
Total Bilirubin: 0.4 mg/dL (ref 0.2–1.2)
Total Protein: 6.2 g/dL (ref 6.0–8.3)

## 2014-08-09 LAB — CALCIUM, IONIZED: CALCIUM ION: 1.67 mmol/L — AB (ref 1.12–1.32)

## 2014-08-09 LAB — PHOSPHORUS: Phosphorus: 2.6 mg/dL (ref 2.3–4.6)

## 2014-08-09 LAB — PARATHYROID HORMONE, INTACT (NO CA): PTH: 154 pg/mL — ABNORMAL HIGH (ref 14–64)

## 2014-08-09 NOTE — Assessment & Plan Note (Signed)
We discussed about hyperparathyroidism, causes, symptoms and treatment at length. I wonder how much of his depression could be related to this etiology. He has been sedentary of late, and I have encouraged him to move about.   He was asked to increase his hydration and stop use of diet teas.  Will check his labs today to assess hypercalcemia etiology.  He will likely need baseline screening DXA and 24 hour urine collection for calcium , creatinine estimation.  Following this workup- will assess whether he could be a surgical candidate for parathyroid surgery.   RTC 6 weeks

## 2014-08-09 NOTE — Assessment & Plan Note (Signed)
Last A1c not at target.   Discussed importance of controlling sugars better. He seems to understand, but lacks motivation probably due to his depression. He denies any SI. I think this is his biggest barrier at this time towards achieving better control.  I have encouraged him to get back with his PCP to discuss further management and symptom control.  He was asked to check sugars 4x daily.  Change lantus to 34 units daily.  Continue Novolog scale as detailed below 8+1 approximately ( he reports that he has the scale at home and will work on taking his insulin with his 2 main meals).  He is not eating consistently and skipping meals. He will try to eat atleast 2 meals daily at BF and lunch. Discussed dietary modifications, portion sizes at prior visits.     RTC 6 weeks

## 2014-08-14 ENCOUNTER — Other Ambulatory Visit (INDEPENDENT_AMBULATORY_CARE_PROVIDER_SITE_OTHER): Payer: Federal, State, Local not specified - PPO

## 2014-08-14 LAB — COMPREHENSIVE METABOLIC PANEL
ALT: 54 U/L — AB (ref 0–53)
AST: 50 U/L — AB (ref 0–37)
Albumin: 3.4 g/dL — ABNORMAL LOW (ref 3.5–5.2)
Alkaline Phosphatase: 153 U/L — ABNORMAL HIGH (ref 39–117)
BUN: 17 mg/dL (ref 6–23)
CHLORIDE: 100 meq/L (ref 96–112)
CO2: 27 meq/L (ref 19–32)
CREATININE: 0.9 mg/dL (ref 0.4–1.5)
Calcium: 11.1 mg/dL — ABNORMAL HIGH (ref 8.4–10.5)
GFR: 87.91 mL/min (ref >60.00)
Glucose, Bld: 212 mg/dL — ABNORMAL HIGH (ref 70–99)
Potassium: 4.5 mEq/L (ref 3.5–5.1)
SODIUM: 134 meq/L — AB (ref 135–145)
TOTAL PROTEIN: 6 g/dL (ref 6.0–8.3)
Total Bilirubin: 0.4 mg/dL (ref 0.2–1.2)

## 2014-08-14 NOTE — Patient Instructions (Signed)
Gave pt 24 hour urine container

## 2014-08-19 ENCOUNTER — Other Ambulatory Visit: Payer: Self-pay | Admitting: *Deleted

## 2014-08-21 LAB — CREATININE, URINE, 24 HOUR
CREATININE, URINE: 55.3 mg/dL
Creatinine, 24H Ur: 1106 mg/d (ref 800–2000)

## 2014-08-21 LAB — CALCIUM, URINE, 24 HOUR
Calcium, 24 hour urine: 260 mg/d — ABNORMAL HIGH (ref 100–250)
Calcium, Ur: 13 mg/dL

## 2014-08-25 ENCOUNTER — Other Ambulatory Visit: Payer: Self-pay | Admitting: Endocrinology

## 2014-09-04 ENCOUNTER — Encounter: Payer: Self-pay | Admitting: *Deleted

## 2014-09-04 ENCOUNTER — Ambulatory Visit: Payer: Self-pay | Admitting: Endocrinology

## 2014-09-04 LAB — HM DEXA SCAN

## 2014-09-09 ENCOUNTER — Other Ambulatory Visit: Payer: Self-pay

## 2014-09-09 MED ORDER — INSULIN ASPART 100 UNIT/ML ~~LOC~~ SOLN: SUBCUTANEOUS | Status: DC

## 2014-09-13 ENCOUNTER — Telehealth: Payer: Self-pay

## 2014-09-13 NOTE — Telephone Encounter (Signed)
Patient notified of the comments below via Mychart message.  Per Dr. Welford RochePhadke: His Forearm DXA ( left FA 1/3 radius) also shows osteoporosis at -2.7 T score ( BMD 0.311) likely due to hyperparathyroidism.  Please let him know that [facility] and [facility], high calcium levels- surgery of parathyroid should be strongly considered and I will discuss more about this at time of upcoming follow up ( 1/21).  In the interim, if he develops symptoms of confusion, mental status changes, severe abdominal pain, then he should go to the Er as these could indicate very high calcium levels. Stay hydrated with water to help keep the calcium levels on the lower side.  thanks

## 2014-09-19 ENCOUNTER — Ambulatory Visit: Payer: Federal, State, Local not specified - PPO | Admitting: Endocrinology

## 2014-12-17 NOTE — H&P (Signed)
PATIENT NAME:  Jackson [name], Jackson [name] MR#:  [id number] DATE OF BIRTH:  [birth year]  DATE OF ADMISSION:  04/23/2012  HISTORY OF PRESENT ILLNESS: 64 year old male came in the ER because of having chief complaint of nausea and vomiting, having very high blood sugar level since last two days. Patient has diabetes since 2006 and he is taking insulin Lantus 22 units two times a day and NovoLog three times a day according to his blood sugar level. Last week he visited [place] in [place] and he had some argument with him. He is under stress with them. He came back here on Thursday to his home and ran out of his Lantus on Friday. He didn't have money to buy new insulin and so he was trying to manage his sugar with only NovoLog short acting insulin but he could not and he noticed readings yesterday were into the 300 to 400 range with fingerstick monitoring. Today he noticed blood sugar level was more than 500 and he started having nausea and vomiting. He denies any abdominal pain, any short of breath, any burning in the urination, any cough, headache, rash on the body.   REVIEW OF SYSTEMS: GENERAL: Patient is alert and looks in mild distress. HEENT: He denies any headache, diplopia, photophobia, neck rigidity. RESPIRATORY: No cough, short of breath, sputum. GASTROINTESTINAL: No diarrhea. Vomiting present. No blood in the vomit. Denies any weight loss or excessive perspiration since long time. GENITOURINARY: He has excessive urination since last two days. No burning in the urine. SKIN: No rash. NEUROLOGICAL: Denies any weakness, any tingling, numbness.  PAST MEDICAL HISTORY: 1. Diabetes. 2. Depression.  PAST SURGICAL HISTORY: None.  FAMILY HISTORY: Diabetes in father and grandmother.   SOCIAL HISTORY: He is widowed, living alone, independent in day to day activity. Smokes occasionally 1 to 2 cigarettes a day. He is agreeable to quit. Drinks beer occasionally. No illegal drug use.   HOME MEDICATIONS: 1. Lantus 22 units  subcutaneous two times a day. 2. NovoLog three times a day depending on the blood sugar level.  3. Baby aspirin 81 mg.  4. Citalopram 40 mg daily. 5. Multivitamin tablet.   PHYSICAL EXAMINATION: GENERAL APPEARANCE: No acute distress, well built, well nourished.  HEENT: Pink conjunctiva. Oral mucosa dry. Good dentition.  NECK: Supple. No mass. No thyroid.   RESPIRATORY: Normal respiratory effort. Clear chest. No rhonchi or crackles.   CARDIOVASCULAR: Regular rate, fast rhythm. No murmurs. No jugular venous distention.   ABDOMEN: Nontender. No mass. No hernia. Bowel sounds present. No rigidity.   GENITOURINARY: No rash or ulcers.   LYMPH NODES: No palpable lymph nodes in neck or axilla.   EXTREMITIES: No cyanosis, clubbing, edema or any rash.   SKIN: Normal to palpation. No rash.   NEUROLOGICAL: Cranial nerves intact. Follows command. No motor or sensory deficit.   PSYCH: He is alert and oriented to time, place and person. Mild agitation due to nauseous feeling.   LABORATORY, DIAGNOSTIC AND RADIOLOGICAL DATA: Labs reviewed on admission which there was severe acidosis, carbon dioxide 7, pH  on ABG 7.21. Blood sugar level more than 600, potassium 4.8, anion gap 131.  ASSESSMENT AND PLAN:  1. Diabetic ketoacidosis due to missed dose of insulin. Patient already received 3 liters normal saline bolus by ER and started on insulin drip. Plan: Normal saline with KCl 20 mEq/100 liters at the rate of 200 mL/h. Continue insulin drip at 0.1 unit/kg/h; that will be 8 mL of insulin drip per hour. Will follow fingerstick  hourly and when it goes less than 250 will switch the IV fluids to D5 half normal saline with KCl 20 mEq in 1 liter at the rate of 200 mL/h and titrate the insulin drip accordingly to keep the fingerstick between 150 and 250. Check BMP and ABG every four hours to see the anion gap and carbon dioxide level. When anion gap is less than 14 and carbon dioxide level is more than 20 we  will give him Lantus 20 units subcutaneous and will start him on diet. We will stop insulin drip after one hour of that and then monitor his blood sugar every three hours initially and then further planning.  2. Depression. Will continue citalopram once his diabetic ketoacidosis is resolved.  3. Smoking. Smoking cessation advised and he agrees to quit smoking.  4. Diabetic education and care manager consult for arranging medication on discharge.  5. Will monitor him in Critical Care Unit for his blood sugar management and acidosis resolution.  6. Nexium IV 24 hours for GI prophylaxis.  7. Heparin 5000 units sub-Q q.12 hours for deep venous thrombosis prophylaxis.  8. Condition and plan discussed with patient. He understands and agrees.   TOTAL TIME SPENT: 50 minutes.    ____________________________ Hope Pigeon Elisabeth Pigeon, MD vgv:cms D: 04/23/2012 12:39:29 ET T: 04/23/2012 12:52:17 ET JOB#: [id number]  cc: Hope Pigeon. Elisabeth Pigeon, MD, <Dictator> Altamese Dilling MD ELECTRONICALLY SIGNED 05/17/2012 14:30

## 2014-12-17 NOTE — Discharge Summary (Signed)
PATIENT NAME:  Jackson [name], Jackson [name] MR#:  [id number] DATE OF BIRTH:  11/19/1950  DATE OF ADMISSION:  04/23/2012 DATE OF DISCHARGE:  04/24/2012  DISCHARGE DIAGNOSIS: Diabetic ketoacidosis.   DISCHARGE CONDITION: Good.  PRESENTATION: 64 year old male came to Emergency Room because of having chief complaint of nausea and vomiting which was also associated with very high blood glucose level for last two days. He was diabetic since last seven years and taking insulin for his diabetes but for last two days he ran out of his insulin and he didn't went to refill his prescription, was trying to manage with short acting insulin but he could not and finally ended up with very high blood sugar level and nausea and vomiting for last two days. Denied all other complaints.   HOSPITAL COURSE: In the ER he was found having severe acidosis, ketones positive and very high blood sugar level and he was admitted with diagnosis of diabetic ketoacidosis. He was managed in Critical Care Unit for his diabetic ketoacidosis with insulin drip, IV fluids and slowly switched over to long acting insulin when there was closure of anion gap and his acidosis resolved. He was started on diet and long acting insulin with coverage after checking his blood sugar level. The other medical problem he had was depression and we continued his medication, Citalopram. He was also a smoker and we advised him smoking cessation and we did counseling about that. Diabetic education was given during the hospital stay by nursing and by me. On 08/26 he was discharged.  DISCHARGE MEDICATIONS: 1. Lantus 22 units subcutaneous two times a day. 2. NovoLog short acting insulin subcutaneous 3 to 4 units up to three times a day before meals after checking his blood sugar level. 3. Citalopram 40 mg oral tablet once a day.   CODE STATUS: FULL CODE.   ALLERGIES: Penicillin.    OTHER INSTRUCTIONS:  1. Home oxygen: No. 2. Diet: Carbohydrate controlled ADA diet. Diet  consistency regular.  3. Activity limitation: None.  4. Referral: None.  5. Return to work: 1 to 2 weeks.  6. Timeframe for follow up with PMD: 1 to 2 weeks.   PRIMARY CARE PHYSICIAN: Dr. Tillman Abideichard Letvak   ____________________________ Hope PigeonVaibhavkumar G. Elisabeth PigeonVachhani, MD vgv:cms D: 04/28/2012 15:24:24 ET T: 04/28/2012 15:49:04 ET JOB#: [id number]  cc: Hope PigeonVaibhavkumar G. Elisabeth PigeonVachhani, MD, <Dictator> Karie Schwalbeichard I. Letvak, MD  Altamese DillingVAIBHAVKUMAR Traeton Bordas MD ELECTRONICALLY SIGNED 05/17/2012 14:35

## 2015-02-11 ENCOUNTER — Ambulatory Visit (INDEPENDENT_AMBULATORY_CARE_PROVIDER_SITE_OTHER): Payer: Federal, State, Local not specified - PPO | Admitting: Endocrinology

## 2015-02-11 ENCOUNTER — Encounter: Payer: Self-pay | Admitting: Endocrinology

## 2015-02-11 VITALS — BP 138/72 | HR 66 | Resp 12 | Ht 74.0 in | Wt 162.5 lb

## 2015-02-11 DIAGNOSIS — E1041 Type 1 diabetes mellitus with diabetic mononeuropathy: Secondary | ICD-10-CM | POA: Diagnosis not present

## 2015-02-11 DIAGNOSIS — E785 Hyperlipidemia, unspecified: Secondary | ICD-10-CM | POA: Diagnosis not present

## 2015-02-11 DIAGNOSIS — E1065 Type 1 diabetes mellitus with hyperglycemia: Principal | ICD-10-CM

## 2015-02-11 DIAGNOSIS — IMO0002 Reserved for concepts with insufficient information to code with codable children: Secondary | ICD-10-CM

## 2015-02-11 DIAGNOSIS — E1049 Type 1 diabetes mellitus with other diabetic neurological complication: Secondary | ICD-10-CM

## 2015-02-11 MED ORDER — INSULIN GLARGINE 100 UNIT/ML ~~LOC~~ SOLN: 34.0000 [IU] | Freq: Every day | SUBCUTANEOUS | Status: AC

## 2015-02-11 MED ORDER — GLUCOSE BLOOD VI STRP: ORAL_STRIP | Status: AC

## 2015-02-11 MED ORDER — INSULIN ASPART 100 UNIT/ML ~~LOC~~ SOLN: SUBCUTANEOUS | Status: AC

## 2015-02-11 NOTE — Progress Notes (Signed)
Pre visit review using our clinic review tool, if applicable. No additional management support is needed unless otherwise documented below in the visit note. 

## 2015-02-11 NOTE — Progress Notes (Signed)
REASON FOR VISIT- Jackson [name] is a 64 y.o.-year-old male,  for management of Type 1 Diabetes Mellitus, uncontrolled, with complications ( DKA episodes 2011, ED). Last seen by me Dec 2015.  HPI- Patient recalls being diagnosed with diabetes in 2006. Following this, he started on insulin therapy at diagnosis. In 2014, was predominantly on basal insulin and little bolus insulin for his glucose control.     Patient is currently on basal/bolus regimen with  - Lantus 34 units units  - Novolog sliding scale  12-15 units with each meal ( uses 1-2 times daily)-not really following the scale that was given to him at last visit *has been more compliant with his Novolog dosing per his recall. Looking fwd to move to [place] to be with his daughter and grandchild. Earlier was depressed and lacked motivation, now thinks that this might be better.  Continues on celexa.     his most recent  A1cs were recorded at  Lab Results  Component Value Date   HGBA1C 11.5* 06/20/2014   HGBA1C 10.3* 03/27/2014   HGBA1C 10.3* 03/26/2013    Patient checks his sugars 1-2  times daily with a One Touch verio glucometer. By recall  his sugars are-  PREMEAL Breakfast Lunch Dinner Bedtime Overall  Glucose range:     65-400  Mean/median:        *"My sugars have been doing good"  Hypoglycemia-  No recent lows; he has hypoglycemia awareness at 100. No previous hypoglycemia admission.  Hyperglycemia-  Previous DKA admissions in 2011.    Dietary Habits- Eats 1-2 times daily. Lives by himself, BF sandwiches and fast food meals often. 4- 5 times weekly, skips BF/meals specially if FS high. Drinks beer with meals 4- 5 times weekly. Needs dentist appt as well as lose front teeth. That affects his eating. Eats BF at [employer] lately- hashbrowns, 2 burritos>>now decreasing fast food intake, eating frozen meals, had bad teeth pulled out and is going to get partials, also decreased alcohol intake- and takes brandy in his  coffee Exercise-  Now  walking Weight- Weight has been trending down recently,  Wt Readings from Last 3 Encounters:  02/11/15 162 lb 8 oz (73.71 kg)  08/08/14 163 lb 4 oz (74.05 kg)  06/20/14 165 lb 5.5 oz (75 kg)    Diabetes Complications-  Nephropathy-- No  CKD, last BUN/creatinine- Lab Results  Component Value Date   BUN 17 08/14/2014   CREATININE 0.9 08/14/2014   Lab Results  Component Value Date   GFR 87.91 08/14/2014   Lab Results  Component Value Date   MICRALBCREAT 0.5 03/27/2014     Retinopathy- No Last DEE was in the past year-Dec 2015 Neuropathy- no numbness and tingling in his feet. Has some numbness in left hand. Has ED. Associated history - No history of CAD or prior stroke. No Hypertension. No hypothyroidism.  his last TSH was  Lab Results  Component Value Date   TSH 2.17 03/26/2013   Hyperlipidemia. his last set of lipids were- taking lipitor 20 mg daily.  Lab Results  Component Value Date   CHOL 185 03/27/2014   HDL 64.20 03/27/2014   LDLCALC 106* 03/27/2014   LDLDIRECT 147.5 03/26/2013   TRIG 73.0 03/27/2014   CHOLHDL 3 03/27/2014   Tobacco use- smokes 1 pack per day. Chronic use.  I have reviewed the patient's past medical history,  medications and allergies.   Hypercalcemia- Noted on labs since 2013. Denies FH hypercalcemia. Denies confusion,  Hx kidney  stones, fractures (non traumatic) or hx osteoporosis. Has been trying to stay mobile and hydrated lately. Not on HCTZ.  No OTC calcium supplements. Denies constipation or hematuria. Since he is going to move to , is interested in getting the NM scan taken.  24 hour urine calcium c/w hypercalciuria in setting of elevated PTH and Ica and low Vitamin D levels DXA Jan 2016- His Forearm DXA ( left FA 1/3 radius) also shows osteoporosis at -2.7 T score ( BMD 0.311) likely due to hyperparathyroidism. BMD right femur neck 0.741 g/cm2 T score -2.5  BMD AP spine T score -1.  Lab Results   Component Value Date   CALCIUM 11.1* 08/14/2014   CALCIUM 12.0* 08/08/2014   CALCIUM 10.6* 06/23/2014   CALCIUM 11.1* 06/21/2014   CALCIUM 10.5 06/20/2014     Current Outpatient Prescriptions on File Prior to Visit  Medication Sig Dispense Refill  . atorvastatin (LIPITOR) 20 MG tablet Take 1 tablet (20 mg total) by mouth daily. 90 tablet 3  . citalopram (CELEXA) 40 MG tablet TAKE 1 TABLET BY MOUTH DAILY 90 tablet 3  . naproxen sodium (ANAPROX) 220 MG tablet Take 220 mg by mouth every 8 (eight) hours as needed (for pain).     No current facility-administered medications on file prior to visit.   Allergies  Allergen Reactions  . Penicillins Anaphylaxis  . Simvastatin     Elevated LFTs    Review of Systems- [ x ]  Complains of    [  ]  denies [  ] Recent weight change [  ]  Fatigue [  ] polydipsia [  ] polyuria [  ]  nocturia [  ]  vision difficulty [  ] chest pain [  ] shortness of breath [ x ] leg swelling- at night time these days [  ] cough [  ] nausea/vomiting [  ] diarrhea [  ] constipation [  ] abdominal pain [  ]  tingling/numbness in extremities [  ]  concern with feet ( wounds/sores)   PHYSICAL EXAM- BP 138/72 mmHg  Pulse 66  Resp 12  Ht  (1.88 m)  Wt 162 lb 8 oz (73.71 kg)  BMI 20.86 kg/m2  SpO2 98% Wt Readings from Last 3 Encounters:  02/11/15 162 lb 8 oz (73.71 kg)  08/08/14 163 lb 4 oz (74.05 kg)  06/20/14 165 lb 5.5 oz (75 kg)   GENERAL: No acute distress HEENT:  Eye exam shows normal external appearance. Oral exam shows normal mucosa .  LUNGS:         Chest is symmetrical. Lungs are clear to auscultation.Marland Kitchen   HEART:         Heart sounds:  S1 and S2 are normal. No murmurs or clicks heard. ABDOMEN:  No Distention present. Liver and spleen are not palpable. No other mass or tenderness present.  EXTREMITIES:     There is no edema. No skin lesions present. 2+ DP.Marland Kitchen  NEUROLOGICAL:     Grossly intact. 2+ reflexes at biceps bilaterally.                  ASSESSMENT/PLAN- 1. Type 1 Diabetes, not controlled 2. Hypercalcemia  Problem List Items Addressed This Visit      Endocrine   Type 1 diabetes mellitus with neurological manifestations, uncontrolled - Primary    Last A1c not at target.  Ovedue for DM follow up labs. Sugars by recall are not at target. Unable to make changes without  any readings. In the past, he has been reluctant to make any suggested changes.  He was asked to check sugars 4x daily.  Continue lantus to 34 units daily.  Continue Novolog scale ashe has been following.  Advised him to bring meter to all future appointments so that insulin could be adjusted.  He is willing to consider this suggestion. Going to get labs today. He was also asked to follow back with Dr Alphonsus Sias          Relevant Medications   insulin aspart (NOVOLOG) 100 UNIT/ML injection   insulin glargine (LANTUS) 100 UNIT/ML injection   Other Relevant Orders   Comprehensive metabolic panel   Hemoglobin A1c   Microalbumin / creatinine urine ratio   Lipid panel   Phosphorus   Parathyroid hormone, intact (no Ca)   Calcium, ionized   NM Parathyroid W/Spect     Other   Hyperlipidemia    Recent LDL close to target on current therapy. Continue Atorvastatin- tolerating well.   Update when fasting.       Relevant Orders   Comprehensive metabolic panel   Hemoglobin A1c   Microalbumin / creatinine urine ratio   Lipid panel   Phosphorus   Parathyroid hormone, intact (no Ca)   Calcium, ionized   NM Parathyroid W/Spect   Hypercalcemia    We discussed about hyperparathyroidism, causes, symptoms and treatment at length. His primary focus for folloow up today was med refill and  Conducting further workup for hyperPTH.  I wonder how much of his depression could be related to this etiology.  I have encouraged him to move about, take 2 dietary servings of calcium daily, stay hydrated with water. Recheck labs today and schedule NM parathyroid scan.  He  will return for a close follow up in 3 weeks with my colleagues at [facility], so that hopefully he can meet with a surgeon before moving to [place].           Relevant Orders   Comprehensive metabolic panel   Hemoglobin A1c   Microalbumin / creatinine urine ratio   Lipid panel   Phosphorus   Parathyroid hormone, intact (no Ca)   Calcium, ionized   NM Parathyroid W/Spect     -RTC  3 weeks with meter and log.  25 minutes spent with the patient, >50% time spent on counseling of topics mentioned above.  Explained that I am [place] out of [place], and he has elected to follow up with Dr Gayland [facility] 02/17/2015, 9:08 AM

## 2015-02-11 NOTE — Patient Instructions (Addendum)
Stay on current insulin.  Labs today. Discussed hyperparathyroidism workup. Agreeable for scheduling NM test.   Please come back for a follow-up appointment in  3weeks.

## 2015-02-13 ENCOUNTER — Telehealth: Payer: Self-pay | Admitting: *Deleted

## 2015-02-13 NOTE — Telephone Encounter (Signed)
Called pt per Dr. Ephriam Jenkins request, pt left appt 02/11/15 without having labs drawn. Called pt and scheduled appt for 02/20/15

## 2015-02-17 ENCOUNTER — Other Ambulatory Visit: Payer: Self-pay | Admitting: Endocrinology

## 2015-02-17 NOTE — Assessment & Plan Note (Signed)
We discussed about hyperparathyroidism, causes, symptoms and treatment at length. His primary focus for folloow up today was med refill and  Conducting further workup for hyperPTH.  I wonder how much of his depression could be related to this etiology.  I have encouraged him to move about, take 2 dietary servings of calcium daily, stay hydrated with water. Recheck labs today and schedule NM parathyroid scan.  He will return for a close follow up in 3 weeks with my colleagues at [facility], so that hopefully he can meet with a surgeon before moving to [place].

## 2015-02-17 NOTE — Assessment & Plan Note (Signed)
Last A1c not at target.  Ovedue for DM follow up labs. Sugars by recall are not at target. Unable to make changes without any readings. In the past, he has been reluctant to make any suggested changes.  He was asked to check sugars 4x daily.  Continue lantus to 34 units daily.  Continue Novolog scale ashe has been following.  Advised him to bring meter to all future appointments so that insulin could be adjusted.  He is willing to consider this suggestion. Going to get labs today. He was also asked to follow back with Dr Alphonsus Sias

## 2015-02-17 NOTE — Assessment & Plan Note (Signed)
Recent LDL close to target on current therapy. Continue Atorvastatin- tolerating well.   Update when fasting.

## 2015-02-20 ENCOUNTER — Encounter
Admission: RE | Admit: 2015-02-20 | Discharge: 2015-02-20 | Disposition: A | Discharge status: Home / Self Care | Payer: Federal, State, Local not specified - PPO | Source: Ambulatory Visit | Attending: Endocrinology | Admitting: Endocrinology

## 2015-02-20 ENCOUNTER — Other Ambulatory Visit (INDEPENDENT_AMBULATORY_CARE_PROVIDER_SITE_OTHER): Payer: Federal, State, Local not specified - PPO

## 2015-02-20 DIAGNOSIS — E1049 Type 1 diabetes mellitus with other diabetic neurological complication: Secondary | ICD-10-CM

## 2015-02-20 DIAGNOSIS — E785 Hyperlipidemia, unspecified: Secondary | ICD-10-CM | POA: Insufficient documentation

## 2015-02-20 DIAGNOSIS — IMO0002 Reserved for concepts with insufficient information to code with codable children: Secondary | ICD-10-CM

## 2015-02-20 DIAGNOSIS — E1041 Type 1 diabetes mellitus with diabetic mononeuropathy: Secondary | ICD-10-CM | POA: Insufficient documentation

## 2015-02-20 DIAGNOSIS — E1065 Type 1 diabetes mellitus with hyperglycemia: Principal | ICD-10-CM

## 2015-02-20 LAB — VITAMIN D 25 HYDROXY (VIT D DEFICIENCY, FRACTURES): VITD: 4.98 ng/mL — ABNORMAL LOW (ref 30.00–100.00)

## 2015-02-20 MED ORDER — TECHNETIUM TC 99M SESTAMIBI - CARDIOLITE
24.5790 | Freq: Once | INTRAVENOUS | Status: AC | PRN
  Administered 2015-02-20: 25.579 via INTRAVENOUS

## 2015-02-24 ENCOUNTER — Encounter: Payer: Self-pay | Admitting: *Deleted

## 2015-03-12 ENCOUNTER — Other Ambulatory Visit (INDEPENDENT_AMBULATORY_CARE_PROVIDER_SITE_OTHER): Payer: Federal, State, Local not specified - PPO

## 2015-03-12 DIAGNOSIS — E1041 Type 1 diabetes mellitus with diabetic mononeuropathy: Secondary | ICD-10-CM | POA: Diagnosis not present

## 2015-03-12 LAB — MICROALBUMIN / CREATININE URINE RATIO
CREATININE, U: 45.6 mg/dL
MICROALB/CREAT RATIO: 1.5 mg/g (ref 0.0–30.0)
Microalb, Ur: <0.7 mg/dL (ref 0.0–1.9)

## 2015-03-12 LAB — COMPREHENSIVE METABOLIC PANEL
ALK PHOS: 160 U/L — AB (ref 39–117)
ALT: 68 U/L — ABNORMAL HIGH (ref 0–53)
AST: 50 U/L — AB (ref 0–37)
Albumin: 3.9 g/dL (ref 3.5–5.2)
BUN: 12 mg/dL (ref 6–23)
CO2: 27 mEq/L (ref 19–32)
Calcium: 10.9 mg/dL — ABNORMAL HIGH (ref 8.4–10.5)
Chloride: 98 mEq/L (ref 96–112)
Creatinine, Ser: 0.93 mg/dL (ref 0.40–1.50)
GFR: 86.66 mL/min (ref >60.00)
GLUCOSE: 233 mg/dL — AB (ref 70–99)
Potassium: 4.5 mEq/L (ref 3.5–5.1)
Sodium: 132 mEq/L — ABNORMAL LOW (ref 135–145)
TOTAL PROTEIN: 6.1 g/dL (ref 6.0–8.3)
Total Bilirubin: 0.4 mg/dL (ref 0.2–1.2)

## 2015-03-12 LAB — LIPID PANEL
CHOL/HDL RATIO: 2
Cholesterol: 160 mg/dL (ref 0–200)
HDL: 73.9 mg/dL (ref >39.00)
LDL Cholesterol: 60 mg/dL (ref 0–99)
NonHDL: 86.1
Triglycerides: 130 mg/dL (ref 0.0–149.0)
VLDL: 26 mg/dL (ref 0.0–40.0)

## 2015-03-12 LAB — PHOSPHORUS: Phosphorus: 2.3 mg/dL (ref 2.3–4.6)

## 2015-03-12 LAB — HEMOGLOBIN A1C: Hgb A1c MFr Bld: 11.1 % — ABNORMAL HIGH (ref 4.6–6.5)

## 2015-03-13 ENCOUNTER — Ambulatory Visit: Payer: Federal, State, Local not specified - PPO | Admitting: Endocrinology

## 2015-03-13 LAB — PARATHYROID HORMONE, INTACT (NO CA): PTH: 139 pg/mL — AB (ref 14–64)

## 2015-03-13 LAB — CALCIUM, IONIZED: Calcium, Ion: 1.48 mmol/L — ABNORMAL HIGH (ref 1.12–1.32)

## 2015-04-02 ENCOUNTER — Ambulatory Visit: Payer: Federal, State, Local not specified - PPO | Admitting: Endocrinology

## 2015-04-04 ENCOUNTER — Other Ambulatory Visit: Payer: Self-pay | Admitting: Internal Medicine
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
